# Patient Record
Sex: Female | Born: 1935 | Race: White | Hispanic: No | Marital: Married | Smoking: Former smoker
Health system: Southern US, Community
[De-identification: ages and names within clinical notes are randomized; demographics above are authoritative.]

## PROBLEM LIST (undated history)

## (undated) DIAGNOSIS — IMO0001 Reserved for inherently not codable concepts without codable children: Secondary | ICD-10-CM

## (undated) DIAGNOSIS — F17200 Nicotine dependence, unspecified, uncomplicated: Secondary | ICD-10-CM

## (undated) DIAGNOSIS — K2970 Gastritis, unspecified, without bleeding: Secondary | ICD-10-CM

## (undated) DIAGNOSIS — M8580 Other specified disorders of bone density and structure, unspecified site: Secondary | ICD-10-CM

## (undated) DIAGNOSIS — I10 Essential (primary) hypertension: Secondary | ICD-10-CM

## (undated) DIAGNOSIS — K589 Irritable bowel syndrome without diarrhea: Secondary | ICD-10-CM

## (undated) DIAGNOSIS — G309 Alzheimer's disease, unspecified: Secondary | ICD-10-CM

## (undated) DIAGNOSIS — K5790 Diverticulosis of intestine, part unspecified, without perforation or abscess without bleeding: Secondary | ICD-10-CM

## (undated) DIAGNOSIS — R030 Elevated blood-pressure reading, without diagnosis of hypertension: Secondary | ICD-10-CM

## (undated) DIAGNOSIS — E785 Hyperlipidemia, unspecified: Secondary | ICD-10-CM

## (undated) DIAGNOSIS — K51411 Inflammatory polyps of colon with rectal bleeding: Secondary | ICD-10-CM

## (undated) DIAGNOSIS — K635 Polyp of colon: Secondary | ICD-10-CM

## (undated) DIAGNOSIS — F0280 Dementia in other diseases classified elsewhere without behavioral disturbance: Secondary | ICD-10-CM

## (undated) HISTORY — DX: Essential (primary) hypertension: I10

## (undated) HISTORY — DX: Other specified disorders of bone density and structure, unspecified site: M85.80

## (undated) HISTORY — DX: Reserved for inherently not codable concepts without codable children: IMO0001

## (undated) HISTORY — DX: Inflammatory polyps of colon with rectal bleeding: K51.411

## (undated) HISTORY — DX: Irritable bowel syndrome, unspecified: K58.9

## (undated) HISTORY — DX: Elevated blood-pressure reading, without diagnosis of hypertension: R03.0

## (undated) HISTORY — DX: Diverticulosis of intestine, part unspecified, without perforation or abscess without bleeding: K57.90

## (undated) HISTORY — DX: Hyperlipidemia, unspecified: E78.5

## (undated) HISTORY — DX: Nicotine dependence, unspecified, uncomplicated: F17.200

## (undated) HISTORY — DX: Polyp of colon: K63.5

## (undated) HISTORY — DX: Gastritis, unspecified, without bleeding: K29.70

---

## 2000-03-06 ENCOUNTER — Other Ambulatory Visit: Admission: RE | Admit: 2000-03-06 | Discharge: 2000-03-06 | Payer: Self-pay | Admitting: Internal Medicine

## 2000-03-06 ENCOUNTER — Encounter (INDEPENDENT_AMBULATORY_CARE_PROVIDER_SITE_OTHER): Payer: Self-pay | Admitting: Specialist

## 2001-07-02 ENCOUNTER — Other Ambulatory Visit: Admission: RE | Admit: 2001-07-02 | Discharge: 2001-07-02 | Payer: Self-pay | Admitting: Family Medicine

## 2003-12-29 ENCOUNTER — Other Ambulatory Visit: Admission: RE | Admit: 2003-12-29 | Discharge: 2003-12-29 | Payer: Self-pay | Admitting: Family Medicine

## 2004-01-31 ENCOUNTER — Encounter: Admission: RE | Admit: 2004-01-31 | Discharge: 2004-01-31 | Payer: Self-pay | Admitting: Family Medicine

## 2004-08-16 ENCOUNTER — Ambulatory Visit (HOSPITAL_COMMUNITY): Admission: RE | Admit: 2004-08-16 | Discharge: 2004-08-16 | Payer: Self-pay | Admitting: Family Medicine

## 2004-08-29 LAB — HM COLONOSCOPY

## 2005-01-05 ENCOUNTER — Emergency Department (HOSPITAL_COMMUNITY): Admission: EM | Admit: 2005-01-05 | Discharge: 2005-01-05 | Payer: Self-pay | Admitting: Emergency Medicine

## 2006-04-02 ENCOUNTER — Ambulatory Visit (HOSPITAL_COMMUNITY): Admission: RE | Admit: 2006-04-02 | Discharge: 2006-04-02 | Payer: Self-pay | Admitting: Family Medicine

## 2006-04-28 ENCOUNTER — Other Ambulatory Visit: Admission: RE | Admit: 2006-04-28 | Discharge: 2006-04-28 | Payer: Self-pay | Admitting: Family Medicine

## 2006-07-29 ENCOUNTER — Other Ambulatory Visit: Admission: RE | Admit: 2006-07-29 | Discharge: 2006-07-29 | Payer: Self-pay | Admitting: Family Medicine

## 2006-12-26 ENCOUNTER — Encounter: Admission: RE | Admit: 2006-12-26 | Discharge: 2006-12-26 | Payer: Self-pay | Admitting: Family Medicine

## 2008-03-08 ENCOUNTER — Encounter: Admission: RE | Admit: 2008-03-08 | Discharge: 2008-03-08 | Payer: Self-pay | Admitting: Family Medicine

## 2009-08-24 ENCOUNTER — Encounter: Admission: RE | Admit: 2009-08-24 | Discharge: 2009-08-24 | Payer: Self-pay | Admitting: Family Medicine

## 2010-10-19 ENCOUNTER — Encounter: Admission: RE | Admit: 2010-10-19 | Discharge: 2010-10-19 | Payer: Self-pay | Admitting: Family Medicine

## 2011-04-18 ENCOUNTER — Encounter: Payer: Self-pay | Admitting: Family Medicine

## 2013-03-10 ENCOUNTER — Ambulatory Visit (INDEPENDENT_AMBULATORY_CARE_PROVIDER_SITE_OTHER): Payer: Medicare Other | Admitting: Family Medicine

## 2013-03-10 ENCOUNTER — Encounter: Payer: Self-pay | Admitting: Family Medicine

## 2013-03-10 VITALS — BP 133/71 | HR 77 | Temp 97.6°F | Ht 59.0 in | Wt 136.0 lb

## 2013-03-10 DIAGNOSIS — R5381 Other malaise: Secondary | ICD-10-CM

## 2013-03-10 DIAGNOSIS — I10 Essential (primary) hypertension: Secondary | ICD-10-CM

## 2013-03-10 DIAGNOSIS — R413 Other amnesia: Secondary | ICD-10-CM

## 2013-03-10 DIAGNOSIS — M899 Disorder of bone, unspecified: Secondary | ICD-10-CM

## 2013-03-10 DIAGNOSIS — E785 Hyperlipidemia, unspecified: Secondary | ICD-10-CM | POA: Insufficient documentation

## 2013-03-10 DIAGNOSIS — D649 Anemia, unspecified: Secondary | ICD-10-CM

## 2013-03-10 DIAGNOSIS — M8580 Other specified disorders of bone density and structure, unspecified site: Secondary | ICD-10-CM

## 2013-03-10 LAB — HEPATIC FUNCTION PANEL
ALT: 12 U/L (ref 0–35)
Albumin: 4.4 g/dL (ref 3.5–5.2)
Bilirubin, Direct: 0.1 mg/dL (ref 0.0–0.3)
Total Protein: 7.3 g/dL (ref 6.0–8.3)

## 2013-03-10 LAB — POCT CBC
Granulocyte percent: 68 %G (ref 37–80)
Lymph, poc: 2.3 (ref 0.6–3.4)
MCHC: 34.2 g/dL (ref 31.8–35.4)
MCV: 86.3 fL (ref 80–97)
Platelet Count, POC: 328 10*3/uL (ref 142–424)
RDW, POC: 13.6 %
WBC: 8.8 10*3/uL (ref 4.6–10.2)

## 2013-03-10 LAB — BASIC METABOLIC PANEL WITH GFR
Calcium: 10.2 mg/dL (ref 8.4–10.5)
GFR, Est African American: 60 mL/min
GFR, Est Non African American: 52 mL/min — ABNORMAL LOW
Potassium: 4.5 mEq/L (ref 3.5–5.3)
Sodium: 140 mEq/L (ref 135–145)

## 2013-03-10 LAB — LIPID PANEL
Cholesterol: 257 mg/dL — ABNORMAL HIGH (ref 0–200)
Triglycerides: 214 mg/dL — ABNORMAL HIGH (ref <150)

## 2013-03-10 NOTE — Patient Instructions (Addendum)
FOBT given Continue current meds and therapeutic lifestyle changes Fall precautions discussed

## 2013-03-10 NOTE — Progress Notes (Signed)
  Subjective:    Patient ID: Toni Everett, female    DOB: 02-Apr-1928, 79 y.o.   MRN: [id number]  HPI This patient presents for recheck of multiple medical problems.   There is no problem list on file for this patient.   In addition,see ROS  The allergies, current medications, past medical history, surgical history, family and social history are reviewed.  Immunizations reviewed.  Health maintenance reviewed.         Review of Systems  Constitutional: Negative.   HENT: Negative.   Eyes: Negative.   Respiratory: Negative.   Cardiovascular: Negative.   Gastrointestinal: Negative.   Genitourinary: Positive for urgency.  Musculoskeletal: Positive for arthralgias (R knee).  Neurological: Negative.   Psychiatric/Behavioral: Positive for sleep disturbance (occasional).       Objective:   Physical Exam BP 133/71  Pulse 77  Temp(Src) 97.6 F (36.4 C) (Oral)  Ht 4\' 11"  (1.499 m)  Wt 136 lb (61.689 kg)  BMI 27.45 kg/m2  The patient appeared well nourished and normally developed, alert and oriented to time and place. Speech, behavior and judgement appear normal. Vital signs as documented.  Head exam is unremarkable. No scleral icterus or pallor noted.  Neck is without jugular venous distension, thyromegally, or carotid bruits. Carotid upstrokes are brisk bilaterally. No cervical adenopathy. Lungs are clear anteriorly and posteriorly to auscultation. Normal respiratory effort. Cardiac exam reveals regular rate and rhythm. First and second heart sounds normal. No murmurs, rubs or gallops.  Abdominal exam reveals normal bowl sounds, no masses, no organomegaly and no aortic enlargement. No inguinal adenopathy. Extremities are nonedematous and both femoral and pedal pulses are normal. Skin without pallor or jaundice.  Warm and dry, without rash. Neurologic exam reveals normal deep tendon reflexes and normal sensation.Pt has some decreased memory and admits to  this.          Assessment & Plan:  1. Other and unspecified hyperlipidemia  - Hepatic function panel - Lipid panel  2. Essential hypertension, benign  - BASIC METABOLIC PANEL WITH GFR  3. Osteopenia  - Vitamin D 25 hydroxy  4. Other malaise and fatigue  - POCT CBC  5. Anemia  - POCT CBC  6. Memory deficit Son lives behind patient and helps her with her meds and house chores

## 2013-03-11 ENCOUNTER — Other Ambulatory Visit: Payer: Self-pay | Admitting: Family Medicine

## 2013-03-11 LAB — VITAMIN D 25 HYDROXY (VIT D DEFICIENCY, FRACTURES): Vit D, 25-Hydroxy: 36 ng/mL (ref 30–89)

## 2013-03-12 NOTE — Telephone Encounter (Signed)
SEE ALLERGIES. WILL SEND CHART BACK FOR REVIEW. PLEASE PRINT. MAIL ORDER.

## 2013-04-05 ENCOUNTER — Telehealth: Payer: Self-pay | Admitting: Nurse Practitioner

## 2013-04-05 ENCOUNTER — Telehealth: Payer: Self-pay | Admitting: Family Medicine

## 2013-04-06 NOTE — Telephone Encounter (Signed)
APPT MADE FOR THURS AM WITH MMM

## 2013-04-08 ENCOUNTER — Ambulatory Visit (INDEPENDENT_AMBULATORY_CARE_PROVIDER_SITE_OTHER): Payer: Medicare Other | Admitting: Nurse Practitioner

## 2013-04-08 ENCOUNTER — Encounter: Payer: Self-pay | Admitting: Nurse Practitioner

## 2013-04-08 VITALS — BP 152/69 | HR 63 | Temp 96.9°F | Ht 62.0 in | Wt 137.0 lb

## 2013-04-08 DIAGNOSIS — B3749 Other urogenital candidiasis: Secondary | ICD-10-CM

## 2013-04-08 DIAGNOSIS — R300 Dysuria: Secondary | ICD-10-CM

## 2013-04-08 DIAGNOSIS — L293 Anogenital pruritus, unspecified: Secondary | ICD-10-CM

## 2013-04-08 DIAGNOSIS — N898 Other specified noninflammatory disorders of vagina: Secondary | ICD-10-CM

## 2013-04-08 LAB — POCT UA - MICROSCOPIC ONLY: Crystals, Ur, HPF, POC: NEGATIVE

## 2013-04-08 LAB — POCT URINALYSIS DIPSTICK
Bilirubin, UA: NEGATIVE
Glucose, UA: NEGATIVE
Spec Grav, UA: 1.02

## 2013-04-08 MED ORDER — FLUCONAZOLE 150 MG PO TABS: ORAL_TABLET | ORAL | Status: DC

## 2013-04-08 MED ORDER — CIPROFLOXACIN HCL 500 MG PO TABS: 500.0000 mg | ORAL_TABLET | Freq: Two times a day (BID) | ORAL | Status: DC

## 2013-04-08 NOTE — Patient Instructions (Signed)
Urinary Tract Infection Urinary tract infections (UTIs) can develop anywhere along your urinary tract. Your urinary tract is your body's drainage system for removing wastes and extra water. Your urinary tract includes two kidneys, two ureters, a bladder, and a urethra. Your kidneys are a pair of bean-shaped organs. Each kidney is about the size of your fist. They are located below your ribs, one on each side of your spine. CAUSES Infections are caused by microbes, which are microscopic organisms, including fungi, viruses, and bacteria. These organisms are so small that they can only be seen through a microscope. Bacteria are the microbes that most commonly cause UTIs. SYMPTOMS  Symptoms of UTIs may vary by age and gender of the patient and by the location of the infection. Symptoms in young women typically include a frequent and intense urge to urinate and a painful, burning feeling in the bladder or urethra during urination. Older women and men are more likely to be tired, shaky, and weak and have muscle aches and abdominal pain. A fever may mean the infection is in your kidneys. Other symptoms of a kidney infection include pain in your back or sides below the ribs, nausea, and vomiting. DIAGNOSIS To diagnose a UTI, your caregiver will ask you about your symptoms. Your caregiver also will ask to provide a urine sample. The urine sample will be tested for bacteria and white blood cells. White blood cells are made by your body to help fight infection. TREATMENT  Typically, UTIs can be treated with medication. Because most UTIs are caused by a bacterial infection, they usually can be treated with the use of antibiotics. The choice of antibiotic and length of treatment depend on your symptoms and the type of bacteria causing your infection. HOME CARE INSTRUCTIONS  If you were prescribed antibiotics, take them exactly as your caregiver instructs you. Finish the medication even if you feel better after you  have only taken some of the medication.  Drink enough water and fluids to keep your urine clear or pale yellow.  Avoid caffeine, tea, and carbonated beverages. They tend to irritate your bladder.  Empty your bladder often. Avoid holding urine for long periods of time.  Empty your bladder before and after sexual intercourse.  After a bowel movement, women should cleanse from front to back. Use each tissue only once. SEEK MEDICAL CARE IF:   You have back pain.  You develop a fever.  Your symptoms do not begin to resolve within 3 days. SEEK IMMEDIATE MEDICAL CARE IF:   You have severe back pain or lower abdominal pain.  You develop chills.  You have nausea or vomiting.  You have continued burning or discomfort with urination. MAKE SURE YOU:   Understand these instructions.  Will watch your condition.  Will get help right away if you are not doing well or get worse. Document Released: 09/11/2005 Document Revised: 06/02/2012 Document Reviewed: 01/10/2012 [employer] Patient Information 2013 [employer].  

## 2013-04-08 NOTE — Progress Notes (Signed)
  Subjective:    Patient ID: Toni Everett, female    DOB: 01-01-1928, 79 y.o.   MRN: [id number]  HPI- Patient in today C/O dysuria and vaginal burning. Patient was eating yogurt at home to seee if would help, but has gotten no relief. Patient had some perineal itching a couple of days ago but not so bad now.    Review of Systems  Constitutional: Negative.   Respiratory: Negative.   Cardiovascular: Negative.   Gastrointestinal: Negative for diarrhea and constipation.  Genitourinary: Positive for dysuria and frequency.       Perineal itching       Objective:   Physical Exam  Constitutional: She appears well-developed and well-nourished.  Cardiovascular: Normal rate, normal heart sounds and intact distal pulses.   Pulmonary/Chest: Effort normal and breath sounds normal.  Abdominal: Soft. She exhibits no mass. There is no tenderness. There is no rebound and no guarding.  Genitourinary:  No CVA tenderness bil   BP 152/69  Pulse 63  Temp(Src) 96.9 F (36.1 C) (Oral)  Ht 5\' 2"  (1.575 m)  Wt 137 lb (62.143 kg)  BMI 25.05 kg/m2 Results for orders placed in visit on 04/08/13  POCT URINALYSIS DIPSTICK      Result Value Range   Color, UA yellow     Clarity, UA clear     Glucose, UA neg     Bilirubin, UA neg     Ketones, UA neg     Spec Grav, UA 1.020     Blood, UA trace     pH, UA 6.5     Protein, UA neg     Urobilinogen, UA negative     Nitrite, UA neg     Leukocytes, UA Trace    POCT UA - MICROSCOPIC ONLY      Result Value Range   WBC, Ur, HPF, POC 2-5     RBC, urine, microscopic 1-5     Bacteria, U Microscopic mod     Mucus, UA trace     Epithelial cells, urine per micros mod     Crystals, Ur, HPF, POC neg     Casts, Ur, LPF, POC neg     Yeast, UA neg        Assessment & Plan:  1. Dysuria  - Urinalysis Dipstick - POCT UA - Microscopic Only  2. Vaginal itching  - POCT Wet Prep with KOH  3. Candida UTI Warm compresses Avoid scratching perineum Force  fluids - ciprofloxacin (CIPRO) 500 MG tablet; Take 1 tablet (500 mg total) by mouth 2 (two) times daily.  Dispense: 14 tablet; Refill: 0 - fluconazole (DIFLUCAN) 150 MG tablet; 1 PO now and repeat in 1 week  Dispense: 2 tablet; Refill: 0  Mary-Margaret Daphine Deutscher, FNP

## 2013-04-08 NOTE — Addendum Note (Signed)
Addended by: Orma Render F on: 04/08/2013 11:54 AM   Modules accepted: Orders

## 2013-05-04 ENCOUNTER — Ambulatory Visit (INDEPENDENT_AMBULATORY_CARE_PROVIDER_SITE_OTHER): Payer: Medicare Other

## 2013-05-04 ENCOUNTER — Ambulatory Visit (HOSPITAL_COMMUNITY)
Admission: RE | Admit: 2013-05-04 | Discharge: 2013-05-04 | Disposition: A | Discharge status: Home / Self Care | Payer: Medicare Other | Source: Ambulatory Visit | Attending: Family Medicine | Admitting: Family Medicine

## 2013-05-04 ENCOUNTER — Ambulatory Visit (INDEPENDENT_AMBULATORY_CARE_PROVIDER_SITE_OTHER): Payer: Medicare Other | Admitting: Family Medicine

## 2013-05-04 VITALS — BP 108/68 | HR 72 | Temp 97.4°F | Ht 62.0 in | Wt 135.0 lb

## 2013-05-04 DIAGNOSIS — S7002XA Contusion of left hip, initial encounter: Secondary | ICD-10-CM | POA: Insufficient documentation

## 2013-05-04 DIAGNOSIS — W19XXXA Unspecified fall, initial encounter: Secondary | ICD-10-CM | POA: Insufficient documentation

## 2013-05-04 DIAGNOSIS — M25552 Pain in left hip: Secondary | ICD-10-CM

## 2013-05-04 DIAGNOSIS — M25559 Pain in unspecified hip: Secondary | ICD-10-CM | POA: Insufficient documentation

## 2013-05-04 DIAGNOSIS — S79929A Unspecified injury of unspecified thigh, initial encounter: Secondary | ICD-10-CM | POA: Insufficient documentation

## 2013-05-04 DIAGNOSIS — S79919A Unspecified injury of unspecified hip, initial encounter: Secondary | ICD-10-CM | POA: Insufficient documentation

## 2013-05-04 DIAGNOSIS — M8530 Osteitis condensans, unspecified site: Secondary | ICD-10-CM | POA: Insufficient documentation

## 2013-05-04 DIAGNOSIS — S7000XA Contusion of unspecified hip, initial encounter: Secondary | ICD-10-CM

## 2013-05-04 MED ORDER — TRAMADOL HCL 50 MG PO TABS: 50.0000 mg | ORAL_TABLET | Freq: Three times a day (TID) | ORAL | Status: DC | PRN

## 2013-05-04 NOTE — Progress Notes (Signed)
Subjective:     Patient ID: Toni Everett, female   DOB: 22-Oct-1928, 79 y.o.   MRN: [id number]  HPI Feel on the left hip a few days ago. Right after the hard rain. Foot got stuck in the mud and she slipped and fell to the left. Left hip hurts to bear weight. No wekness in the leg  Past Medical History  Diagnosis Date  . Hyperlipidemia   . IBS (irritable bowel syndrome)   . Nicotine addiction   . Diverticulosis   . Osteopenia   . Elevated blood pressure   . Colon polyps   . Inflammatory polyps of colon with rectal bleeding   . Gastritis    History reviewed. No pertinent past surgical history. Current Outpatient Prescriptions on File Prior to Visit  Medication Sig Dispense Refill  . Cholecalciferol (VITAMIN D3) 1000 UNITS CAPS Take by mouth. Take 1-2 tablets by mouth daily       . lisinopril-hydrochlorothiazide (PRINZIDE,ZESTORETIC) 20-12.5 MG per tablet Take 1 tablet by mouth  every day for Blood  pressure  90 tablet  0   No current facility-administered medications on file prior to visit.   Allergies  Allergen Reactions  . Alcohol-Sulfur (Sulfur)     Patient is intolerant to all alcohol base medicines   . Astelin   . Baycol (Cerivastatin Sodium)   . Cholinesterase Inhibitors   . Clolar (Clofarabine)   . Codeine   . Conj Estrog-Medroxyprogest Ace   . Diovan (Valsartan)   . Fexofenadine Hcl   . Lipitor (Atorvastatin Calcium)   . Lovaza (Omega-3-Acid Ethyl Esters)   . Niaspan (Niacin)   . Norvasc (Amlodipine Besylate)   . Uniretic (Moexipril-Hydrochlorothiazide)     There is no immunization history on file for this patient. History   Social History  . Marital Status: Married    Spouse Name: N/A    Number of Children: 3  . Years of Education: N/A   Occupational History  . Not on file.   Social History Main Topics  . Smoking status: Former Smoker -- 1.00 packs/day    Quit date: 12/16/1998  . Smokeless tobacco: Not on file  . Alcohol Use: No  . Drug Use: No   . Sexually Active: Not on file   Other Topics Concern  . Not on file   Social History Narrative  . No narrative on file    Review of Systems    nil else on ROS. Objective:   Physical Exam APPEARANCE:  Patient in no acute distress.The patient appeared well nourished and normally developed. Acyanotic. Waist: VITAL SIGNS:BP 108/68  Pulse 72  Temp(Src) 97.4 F (36.3 C) (Oral)  Ht 5\' 2"  (1.575 m)  Wt 135 lb (61.236 kg)  BMI 24.69 kg/m2 Elderly frail WF  SKIN: warm and  Dry without overt rashes, tattoos and scars  HEAD and Neck: without JVD, Head and scalp: normal Eyes:No scleral icterus. Fundi normal, eye movements normal. Ears: Auricle normal, canal normal, Tympanic membranes normal, insufflation normal. Nose: normal Throat: normal Neck & thyroid: normal  CHEST & LUNGS: Chest wall: normal Lungs: Clear  CVS: Reveals the PMI to be normally located. Regular rhythm, First and Second Heart sounds are normal,  absence of murmurs, rubs or gallops. Peripheral vasculature: Radial pulses:reduced but present.  ABDOMEN:  Appearance: normal Benign,, no organomegaly, no masses, no Abdominal Aortic enlargement. No Guarding , no rebound. No Bruits. Bowel sounds: normal  RECTAL: N/A GU: N/A  EXTREMETIES: nonedematous.   MUSCULOSKELETAL:  Spine:mild kyphosis  Joints:left hip. Pain to bare weight. Reduced ROM especially withinternal and external rotation. Limping. Leg length equal.  NEUROLOGIC: oriented to ,place and person; nonfocal. Orders Placed This Encounter  Procedures  . DG Hip Complete Left    Standing Status: Future     Number of Occurrences: 1     Standing Expiration Date: 07/04/2014    Order Specific Question:  Reason for Exam (SYMPTOM  OR DIAGNOSIS REQUIRED)    Answer:  left hip pain    Order Specific Question:  Preferred imaging location?    Answer:  Internal   Results for orders placed in visit on 04/08/13  POCT URINALYSIS DIPSTICK      Result  Value Range   Color, UA yellow     Clarity, UA clear     Glucose, UA neg     Bilirubin, UA neg     Ketones, UA neg     Spec Grav, UA 1.020     Blood, UA trace     pH, UA 6.5     Protein, UA neg     Urobilinogen, UA negative     Nitrite, UA neg     Leukocytes, UA Trace    POCT UA - MICROSCOPIC ONLY      Result Value Range   WBC, Ur, HPF, POC 2-5     RBC, urine, microscopic 1-5     Bacteria, U Microscopic mod     Mucus, UA trace     Epithelial cells, urine per micros mod     Crystals, Ur, HPF, POC neg     Casts, Ur, LPF, POC neg     Yeast, UA neg         Assessment:     Hip pain, left - Plan: DG Hip Complete Left, CT Hip Left Wo Contrast, traMADol (ULTRAM) 50 MG tablet, CANCELED: CT Hip Left Wo Contrast, CANCELED: CT Hip Left W Wo Contrast  Contusion of left hip, initial encounter - Plan: traMADol (ULTRAM) 50 MG tablet      Plan:     [facility] reading (PRIMARY) by  Dr.Hebe Merriwether: No obvious Fractures  Seen. But slight shortening of the neck of the femur makes me suspicious about impacted fracture, correlating with clinical findings.                     Ct left hip call report no obvious fracture.  Meds ordered this encounter  Medications  . diphenoxylate-atropine (LOMOTIL) 2.5-0.025 MG per tablet    Sig: Take 1 tablet by mouth 4 (four) times daily as needed for diarrhea or loose stools.  . traMADol (ULTRAM) 50 MG tablet    Sig: Take 1 tablet (50 mg total) by mouth every 8 (eight) hours as needed for pain.    Dispense:  30 tablet    Refill:  0   Ice to left hip, BenGays or Biofreeze.  RTc in 1 week for follow up if not better.

## 2013-05-11 ENCOUNTER — Telehealth: Payer: Self-pay | Admitting: Family Medicine

## 2013-05-11 NOTE — Telephone Encounter (Signed)
APPT MADE

## 2013-05-17 ENCOUNTER — Encounter: Payer: Self-pay | Admitting: Family Medicine

## 2013-05-17 ENCOUNTER — Ambulatory Visit (INDEPENDENT_AMBULATORY_CARE_PROVIDER_SITE_OTHER): Payer: Medicare Other | Admitting: Family Medicine

## 2013-05-17 VITALS — BP 146/67 | HR 73 | Temp 97.5°F | Wt 134.0 lb

## 2013-05-17 DIAGNOSIS — S7002XA Contusion of left hip, initial encounter: Secondary | ICD-10-CM

## 2013-05-17 DIAGNOSIS — S7000XA Contusion of unspecified hip, initial encounter: Secondary | ICD-10-CM

## 2013-05-17 DIAGNOSIS — M25552 Pain in left hip: Secondary | ICD-10-CM

## 2013-05-17 DIAGNOSIS — I10 Essential (primary) hypertension: Secondary | ICD-10-CM

## 2013-05-17 DIAGNOSIS — R413 Other amnesia: Secondary | ICD-10-CM

## 2013-05-17 DIAGNOSIS — M25559 Pain in unspecified hip: Secondary | ICD-10-CM

## 2013-05-17 DIAGNOSIS — E785 Hyperlipidemia, unspecified: Secondary | ICD-10-CM

## 2013-05-17 NOTE — Progress Notes (Signed)
Patient ID: AUBRI GATHRIGHT, female   DOB: Jan 23, 1928, 79 y.o.   MRN: [id number] SUBJECTIVE: HPI: Came for follow up. Had a fall unto left hip[  And had Xrays and CT. Fine now can walk without problems. Getting older.   PMH/PSH: reviewed/updated in Epic  SH/FH: reviewed/updated in Epic  Allergies: reviewed/updated in Epic  Medications: reviewed/updated in Epic  Immunizations: reviewed/updated in Epic  ROS: As above in the HPI. All other systems are stable or negative.  OBJECTIVE: APPEARANCE:  Patient in no acute distress.The patient appeared well nourished and normally developed. Acyanotic. Waist: VITAL SIGNS:BP 146/67  Pulse 73  Temp(Src) 97.5 F (36.4 C) (Oral)  Wt 134 lb (60.782 kg)  BMI 24.5 kg/m2   SKIN: warm and  Dry without overt rashes, tattoos and scars  HEAD and Neck: without JVD, Head and scalp: normal Eyes:No scleral icterus. Fundi normal, eye movements normal. Ears: Auricle normal, canal normal, Tympanic membranes normal, insufflation normal. Nose: normal Throat: normal Neck & thyroid: normal  CHEST & LUNGS: Chest wall: normal Lungs: Clear  CVS: Reveals the PMI to be normally located. Regular rhythm, First and Second Heart sounds are normal,  absence of murmurs, rubs or gallops. Peripheral vasculature: Radial pulses: normal Dorsal pedis pulses: normal Posterior pulses: normal  ABDOMEN:  Appearance: normal Benign,, no organomegaly, no masses, no Abdominal Aortic enlargement. No Guarding , no rebound. No Bruits. Bowel sounds: normal  RECTAL: N/A GU: N/A  EXTREMETIES: nonedematous. Both Femoral and Pedal pulses are normal.  MUSCULOSKELETAL:  Spine: normal Joints: intact  NEUROLOGIC: oriented to time,place and person; nonfocal. Strength is normal Sensory is normal Reflexes are normal Cranial Nerves are normal.  ASSESSMENT: Hip pain, left  Other and unspecified hyperlipidemia  Memory deficit  Essential hypertension,  benign  Contusion of left hip, initial encounter  Hip pain resolved. Better  PLAN: Return in about 6 weeks (around 06/28/2013) for Recheck medical problems.  Continue present meds.  Results for orders placed in visit on 04/08/13  POCT URINALYSIS DIPSTICK      Result Value Range   Color, UA yellow     Clarity, UA clear     Glucose, UA neg     Bilirubin, UA neg     Ketones, UA neg     Spec Grav, UA 1.020     Blood, UA trace     pH, UA 6.5     Protein, UA neg     Urobilinogen, UA negative     Nitrite, UA neg     Leukocytes, UA Trace    POCT UA - MICROSCOPIC ONLY      Result Value Range   WBC, Ur, HPF, POC 2-5     RBC, urine, microscopic 1-5     Bacteria, U Microscopic mod     Mucus, UA trace     Epithelial cells, urine per micros mod     Crystals, Ur, HPF, POC neg     Casts, Ur, LPF, POC neg     Yeast, UA neg     Gwynn Chalker P. [place]esto Charon, M.D.

## 2013-05-27 ENCOUNTER — Encounter: Payer: Self-pay | Admitting: *Deleted

## 2013-06-23 ENCOUNTER — Ambulatory Visit: Payer: Medicare Other | Admitting: Family Medicine

## 2013-07-01 ENCOUNTER — Ambulatory Visit: Payer: Medicare Other | Admitting: Family Medicine

## 2013-08-04 ENCOUNTER — Ambulatory Visit (INDEPENDENT_AMBULATORY_CARE_PROVIDER_SITE_OTHER): Payer: Medicare Other | Admitting: Family Medicine

## 2013-08-04 ENCOUNTER — Encounter: Payer: Self-pay | Admitting: Family Medicine

## 2013-08-04 VITALS — BP 126/68 | HR 70 | Temp 97.7°F | Ht 59.0 in | Wt 130.6 lb

## 2013-08-04 DIAGNOSIS — R5383 Other fatigue: Secondary | ICD-10-CM

## 2013-08-04 DIAGNOSIS — R5381 Other malaise: Secondary | ICD-10-CM

## 2013-08-04 DIAGNOSIS — E559 Vitamin D deficiency, unspecified: Secondary | ICD-10-CM

## 2013-08-04 DIAGNOSIS — R350 Frequency of micturition: Secondary | ICD-10-CM

## 2013-08-04 DIAGNOSIS — I10 Essential (primary) hypertension: Secondary | ICD-10-CM

## 2013-08-04 DIAGNOSIS — J309 Allergic rhinitis, unspecified: Secondary | ICD-10-CM

## 2013-08-04 DIAGNOSIS — E785 Hyperlipidemia, unspecified: Secondary | ICD-10-CM

## 2013-08-04 DIAGNOSIS — R413 Other amnesia: Secondary | ICD-10-CM

## 2013-08-04 LAB — POCT CBC
Granulocyte percent: 65 %G (ref 37–80)
HCT, POC: 41.8 % (ref 37.7–47.9)
Lymph, poc: 1.8 (ref 0.6–3.4)
MCV: 87 fL (ref 80–97)
POC Granulocyte: 3.8 (ref 2–6.9)
Platelet Count, POC: 296 10*3/uL (ref 142–424)
RBC: 4.8 M/uL (ref 4.04–5.48)
RDW, POC: 13.6 %
WBC: 5.9 10*3/uL (ref 4.6–10.2)

## 2013-08-04 LAB — POCT URINALYSIS DIPSTICK
Ketones, UA: NEGATIVE
Nitrite, UA: NEGATIVE
Protein, UA: NEGATIVE

## 2013-08-04 LAB — POCT UA - MICROALBUMIN: Microalbumin Ur, POC: 20 mg/L

## 2013-08-04 LAB — POCT UA - MICROSCOPIC ONLY: Yeast, UA: NEGATIVE

## 2013-08-04 NOTE — Patient Instructions (Addendum)
Fall precautions discussed Continue current meds and therapeutic lifestyle changes Return to clinic in September or October for a flu shot Patient encouraged to schedule  Mammogram Drink plenty of fluids Take an extra strength Tylenol 3 or 4 times daily as needed for arthritis pain

## 2013-08-04 NOTE — Addendum Note (Signed)
Addended by: Lisbeth Ply C on: 08/04/2013 10:55 AM   Modules accepted: Orders

## 2013-08-04 NOTE — Progress Notes (Signed)
Subjective:    Patient ID: Toni Everett, female    DOB: 1928/12/15, 79 y.o.   MRN: [id number]  HPI Patient returns to clinic today for followup and management of chronic medical problems. These include hypertension, hyperlipidemia, osteoarthritis, vitamin D deficiency, and memory deficit. Her biggest complaint today is the arthritis in her knees especially her right knee. When asked specific questions she seemed to have a relapse of memory. She says she walks nightly with her son. She is not very active other than maintaining her home.   Review of Systems  Constitutional: Positive for fatigue (slight).  HENT: Negative for ear pain, congestion, sore throat, rhinorrhea, sneezing and postnasal drip.   Eyes: Positive for visual disturbance (will schedule eye exam). Negative for photophobia, pain, discharge, redness and itching.  Respiratory: Negative.  Negative for cough, choking, chest tightness, shortness of breath and wheezing.   Cardiovascular: Negative.  Negative for chest pain, palpitations and leg swelling.  Gastrointestinal: Positive for diarrhea (intermitent).  Endocrine: Negative.  Negative for cold intolerance, heat intolerance, polydipsia, polyphagia and polyuria.  Genitourinary: Negative for dysuria, urgency, frequency, hematuria, vaginal bleeding, vaginal discharge and vaginal pain.  Musculoskeletal: Positive for myalgias (bilateral legs) and arthralgias (L knee).  Skin: Negative.  Negative for color change, pallor, rash and wound.  Allergic/Immunologic: Positive for environmental allergies (seasonal).  Neurological: Negative for dizziness, tremors, weakness, light-headedness, numbness and headaches.  Hematological: Does not bruise/bleed easily.  Psychiatric/Behavioral: Positive for sleep disturbance (trouble getting to sleep) and decreased concentration (slight memory deficit). Negative for confusion and agitation. The patient is not nervous/anxious.        Objective:   Physical Exam BP 126/68  Pulse 70  Temp(Src) 97.7 F (36.5 C) (Oral)  Ht 4\' 11"  (1.499 m)  Wt 130 lb 9.6 oz (59.24 kg)  BMI 26.36 kg/m2  The patient appeared well nourished and normally developed, but her alertness and orientation to time and place seem to be slightly diminished. Speech, behavior and judgement appear normal. Vital signs as documented.  Head exam is unremarkable. No scleral icterus or pallor noted. Ears nose and throat were normal. Neck is without jugular venous distension, thyromegally, or carotid bruits. Carotid upstrokes are brisk bilaterally. No cervical adenopathy. Lungs are clear anteriorly and posteriorly to auscultation. Normal respiratory effort. Cardiac exam reveals regular rate and rhythm at 72 per min. First and second heart sounds normal.  No murmurs, rubs or gallops.  Abdominal exam reveals normal bowl sounds, no masses, no organomegaly and no aortic enlargement. No inguinal adenopathy. There is no abdominal tenderness. Extremities are nonedematous and both femoral and pedal pulses are normal. Skin without pallor or jaundice.  Warm and dry, without rash. Neurologic exam reveals normal deep tendon reflexes and normal sensation.          Assessment & Plan:  1. Hypertension - POCT CBC - BMP8+EGFR  2. Hyperlipidemia, statin intolerant - Hepatic function panel; Standing - Lipid panel; Standing  3. Fatigue - POCT CBC  4. Vitamin D deficiency - Vit D25 hydroxy(osteoporosis monitoring); Standing  5. Urinary frequency - POCT UA - Microalbumin - POCT UA - Microscopic Only - Urine culture  6. Allergic rhinitis   7. Memory deficits Patient Instructions  Fall precautions discussed Continue current meds and therapeutic lifestyle changes Return to clinic in September or October for a flu shot Patient encouraged to schedule  Mammogram Drink plenty of fluids Take an extra strength Tylenol 3 or 4 times daily as needed for arthritis pain   Roe Coombs  Hoy Register MD

## 2013-08-04 NOTE — Addendum Note (Signed)
Addended by: Roselyn Reef on: 08/04/2013 10:49 AM   Modules accepted: Orders

## 2013-08-04 NOTE — Addendum Note (Signed)
Addended by: Prescott Gum on: 08/04/2013 10:46 AM   Modules accepted: Orders

## 2013-08-04 NOTE — Addendum Note (Signed)
Addended by: Lisbeth Ply C on: 08/04/2013 10:14 AM   Modules accepted: Orders

## 2013-08-05 LAB — HEPATIC FUNCTION PANEL
ALT: 12 IU/L (ref 0–32)
AST: 18 IU/L (ref 0–40)
Albumin: 4.1 g/dL (ref 3.5–4.7)
Bilirubin, Direct: 0.08 mg/dL (ref 0.00–0.40)
Total Bilirubin: 0.3 mg/dL (ref 0.0–1.2)

## 2013-08-05 LAB — BMP8+EGFR
Calcium: 9.7 mg/dL (ref 8.6–10.2)
Creatinine, Ser: 1.09 mg/dL — ABNORMAL HIGH (ref 0.57–1.00)
GFR calc Af Amer: 55 mL/min/{1.73_m2} — ABNORMAL LOW (ref >59)
GFR calc non Af Amer: 47 mL/min/{1.73_m2} — ABNORMAL LOW (ref >59)
Glucose: 86 mg/dL (ref 65–99)

## 2013-08-05 LAB — URINE CULTURE

## 2013-08-05 LAB — LIPID PANEL
Chol/HDL Ratio: 5 ratio units — ABNORMAL HIGH (ref 0.0–4.4)
VLDL Cholesterol Cal: 48 mg/dL — ABNORMAL HIGH (ref 5–40)

## 2013-08-05 LAB — VITAMIN D 25 HYDROXY (VIT D DEFICIENCY, FRACTURES): Vit D, 25-Hydroxy: 20.1 ng/mL — ABNORMAL LOW (ref 30.0–100.0)

## 2013-08-09 ENCOUNTER — Telehealth: Payer: Self-pay | Admitting: *Deleted

## 2013-08-09 NOTE — Telephone Encounter (Signed)
Pt's son Onalee Hua notified of results Pt not currently taking Vit D Please advise

## 2013-08-09 NOTE — Telephone Encounter (Signed)
Pt's son notified

## 2013-08-09 NOTE — Telephone Encounter (Signed)
Message copied by Bearl Mulberry on Mon Aug 09, 2013  1:04 PM ------      Message from: Ernestina Penna      Created: Thu Aug 05, 2013  8:21 AM       On the BMP, blood sugar was good. The creatinine, and important kidney function tests, was slightly elevated as it has been in the past. We will continue to monitor this. All electrolytes were within normal limit.      The vitamin D level was low.-------- confirm the dose of vitamin D that she is currently taking.+++++++++      Liver function tests within normal limit      Only traditional lipid panel, the LDL C. was elevated at 146, this number should be less than 100. The triglycerides were elevated at 238 this should be less than 150---------- she must try to do better with diet and exercise, as she is intolerant of all statins            Please talk to her son, Onalee Hua, regarding all of these results.+++++++++ ------

## 2013-08-09 NOTE — Telephone Encounter (Signed)
Tell her son to get D3 1000  1 daily, for the Now Brand

## 2013-08-16 ENCOUNTER — Other Ambulatory Visit: Payer: Self-pay | Admitting: Family Medicine

## 2013-08-18 ENCOUNTER — Telehealth: Payer: Self-pay | Admitting: Family Medicine

## 2013-08-18 NOTE — Telephone Encounter (Signed)
done

## 2013-08-23 ENCOUNTER — Telehealth: Payer: Self-pay | Admitting: Family Medicine

## 2013-08-24 NOTE — Telephone Encounter (Signed)
Ups already came and meds are there

## 2013-09-20 ENCOUNTER — Telehealth: Payer: Self-pay | Admitting: Family Medicine

## 2013-09-23 ENCOUNTER — Other Ambulatory Visit: Payer: Self-pay | Admitting: *Deleted

## 2013-09-23 MED ORDER — DIPHENOXYLATE-ATROPINE 2.5-0.025 MG PO TABS: 1.0000 | ORAL_TABLET | Freq: Four times a day (QID) | ORAL | Status: DC | PRN

## 2013-09-23 NOTE — Telephone Encounter (Signed)
CALL IN [facility]. LAST OV 8/20.

## 2013-09-23 NOTE — Telephone Encounter (Signed)
Please phone in lomotil

## 2013-09-24 NOTE — Telephone Encounter (Signed)
Script done.

## 2013-09-28 ENCOUNTER — Telehealth: Payer: Self-pay | Admitting: *Deleted

## 2013-09-28 MED ORDER — DIPHENOXYLATE-ATROPINE 2.5-0.025 MG PO TABS: 1.0000 | ORAL_TABLET | Freq: Every day | ORAL | Status: DC

## 2013-09-28 NOTE — Telephone Encounter (Signed)
Son came to office after picking up rx for Lomotil and said she was only taking one qd and directions of rx called in was for qid. I called [facility] and they verified last refill 12/13 was for qd. Discussed with son and he stated she would take one qd as needed. Can you change how she is taking in computer so with next refill we can order for just qd. Thanks.

## 2013-11-29 ENCOUNTER — Telehealth: Payer: Self-pay | Admitting: Family Medicine

## 2013-12-01 ENCOUNTER — Other Ambulatory Visit: Payer: Self-pay

## 2013-12-01 MED ORDER — DIPHENOXYLATE-ATROPINE 2.5-0.025 MG PO TABS: 1.0000 | ORAL_TABLET | Freq: Every day | ORAL | Status: DC

## 2013-12-01 NOTE — Telephone Encounter (Signed)
Please okay this prescription as needed with refill

## 2013-12-02 NOTE — Telephone Encounter (Signed)
rx called in

## 2013-12-23 ENCOUNTER — Ambulatory Visit (INDEPENDENT_AMBULATORY_CARE_PROVIDER_SITE_OTHER): Payer: Medicare Other | Admitting: Family Medicine

## 2013-12-23 ENCOUNTER — Ambulatory Visit (INDEPENDENT_AMBULATORY_CARE_PROVIDER_SITE_OTHER): Payer: Medicare Other

## 2013-12-23 ENCOUNTER — Encounter (INDEPENDENT_AMBULATORY_CARE_PROVIDER_SITE_OTHER): Payer: Self-pay

## 2013-12-23 ENCOUNTER — Encounter: Payer: Self-pay | Admitting: Family Medicine

## 2013-12-23 VITALS — BP 144/66 | HR 71 | Temp 96.8°F | Ht 59.0 in | Wt 129.0 lb

## 2013-12-23 DIAGNOSIS — R413 Other amnesia: Secondary | ICD-10-CM

## 2013-12-23 DIAGNOSIS — E785 Hyperlipidemia, unspecified: Secondary | ICD-10-CM

## 2013-12-23 DIAGNOSIS — Z789 Other specified health status: Secondary | ICD-10-CM

## 2013-12-23 DIAGNOSIS — I10 Essential (primary) hypertension: Secondary | ICD-10-CM

## 2013-12-23 DIAGNOSIS — Z23 Encounter for immunization: Secondary | ICD-10-CM

## 2013-12-23 DIAGNOSIS — Z0000 Encounter for general adult medical examination without abnormal findings: Secondary | ICD-10-CM

## 2013-12-23 LAB — POCT CBC
Granulocyte percent: 71.6 %G (ref 37–80)
HCT, POC: 46 % (ref 37.7–47.9)
Hemoglobin: 14.6 g/dL (ref 12.2–16.2)
LYMPH, POC: 1.9 (ref 0.6–3.4)
MCH, POC: 28.3 pg (ref 27–31.2)
MCHC: 31.7 g/dL — AB (ref 31.8–35.4)
MCV: 89.2 fL (ref 80–97)
MPV: 7.5 fL (ref 0–99.8)
POC GRANULOCYTE: 5.8 (ref 2–6.9)
POC LYMPH %: 23.8 % (ref 10–50)
Platelet Count, POC: 307 10*3/uL (ref 142–424)
RBC: 5.2 M/uL (ref 4.04–5.48)
RDW, POC: 12.9 %
WBC: 8.1 10*3/uL (ref 4.6–10.2)

## 2013-12-23 MED ORDER — DONEPEZIL HCL 5 MG PO TABS: 5.0000 mg | ORAL_TABLET | Freq: Every day | ORAL | Status: DC

## 2013-12-23 NOTE — Addendum Note (Signed)
Addended by: Magdalene RiverBULLINS, JAMIE H on: 12/23/2013 12:50 PM   Modules accepted: Orders

## 2013-12-23 NOTE — Patient Instructions (Addendum)
Continue current medications. Continue good therapeutic lifestyle changes which include good diet and exercise. Fall precautions discussed with patient. Schedule your flu vaccine if you haven't had it yet If you are over 153 years old - you may need Prevnar 13 or the adult Pneumonia vaccine. When you see your new doctor please sign a records release and we will see that our records are transferred to her The splinter drink plenty of fluids and use a cool mist humidifier to keep your respiratory passages moist

## 2013-12-23 NOTE — Progress Notes (Signed)
Subjective:    Patient ID: Toni Everett, female    DOB: 1927/12/24, 80 y.o.   MRN: [id number]  HPI Pt here for follow up and management of chronic medical problems. Patient comes in today to transfer her care to a more convenient location as her son has just retired and he will be taking her to his medical group. It was obvious in the initial visit to the room with the patient today that her memory is declining very rapidly. She did not have an appropriate response to many of the questions that are posed to her.        Patient Active Problem List   Diagnosis Date Noted  . Hip pain 05/04/2013  . Contusion of left hip 05/04/2013  . Essential hypertension, benign 03/10/2013  . Other and unspecified hyperlipidemia 03/10/2013  . Memory deficit 03/10/2013   Outpatient Encounter Prescriptions as of 12/23/2013  Medication Sig  . diphenoxylate-atropine (LOMOTIL) 2.5-0.025 MG per tablet Take 1 tablet by mouth daily.  Marland Kitchen lisinopril-hydrochlorothiazide (PRINZIDE,ZESTORETIC) 20-12.5 MG per tablet Take 1 tablet by mouth  every day for Blood  pressure    Review of Systems  Constitutional: Negative.   HENT: Negative.   Eyes: Negative.   Respiratory: Negative.   Cardiovascular: Negative.   Gastrointestinal: Negative.   Endocrine: Negative.   Genitourinary: Negative.   Musculoskeletal: Negative.   Skin: Negative.   Allergic/Immunologic: Negative.   Neurological: Negative.   Hematological: Negative.   Psychiatric/Behavioral: Negative.        Objective:   Physical Exam  Nursing note and vitals reviewed. Constitutional: She is oriented to person, place, and time. She appears well-developed and well-nourished. No distress.  Suite and pleasant but with obvious memory deficits more than in the past  HENT:  Head: Normocephalic and atraumatic.  Right Ear: External ear normal.  Left Ear: External ear normal.  Nose: Nose normal.  Mouth/Throat: Oropharynx is clear and moist. No  oropharyngeal exudate.  Eyes: Conjunctivae and EOM are normal. Pupils are equal, round, and reactive to light. Right eye exhibits no discharge. Left eye exhibits no discharge. No scleral icterus.  Neck: Normal range of motion. Neck supple. No thyromegaly present.  Cardiovascular: Normal rate, regular rhythm, normal heart sounds and intact distal pulses.  Exam reveals no gallop and no friction rub.   No murmur heard. At 72 per minute  Pulmonary/Chest: Effort normal and breath sounds normal. No respiratory distress. She has no wheezes. She has no rales. She exhibits no tenderness.  No axillary adenopathy  Abdominal: Soft. Bowel sounds are normal. She exhibits no mass. There is no tenderness. There is no rebound and no guarding.  Musculoskeletal: Normal range of motion. She exhibits no edema and no tenderness.  Lymphadenopathy:    She has no cervical adenopathy.  Neurological: She is alert and oriented to person, place, and time. She has normal reflexes.  Definite memory deficits apparent today in the exam. She did not know her age she would not sure who was the father of her son and she simply stated that I just do not try remember anything anymore.  Skin: Skin is warm and dry.  Psychiatric: She has a normal mood and affect. Her behavior is normal.   BP 144/66  Pulse 71  Temp(Src) 96.8 F (36 C) (Oral)  Ht 4' 11"  (1.499 m)  Wt 129 lb (58.514 kg)  BMI 26.04 kg/m2  [facility] reading (PRIMARY) by  Dr. Brunilda Payor x-ray-kyphosis no active disease  Assessment & Plan:   1. Essential hypertension, benign - POCT CBC - BMP8+EGFR - Hepatic function panel  2. Other and unspecified hyperlipidemia - POCT CBC - Lipid panel  3. Health care maintenance - Vit D  25 hydroxy (rtn osteoporosis monitoring)  4. Statin intolerance  5. Memory deficit -Discussed with her son today the need for more assertive a lot -Will start Aricept 5 mg daily and we'll increase  this dose in a couple of months to 10 mg -Also discussed the possible need for a lifeline for her at home   Patient Instructions  Continue current medications. Continue good therapeutic lifestyle changes which include good diet and exercise. Fall precautions discussed with patient. Schedule your flu vaccine if you haven't had it yet If you are over 68 years old - you may need Prevnar 84 or the adult Pneumonia vaccine. When you see your new doctor please sign a records release and we will see that our records are transferred to her The splinter drink plenty of fluids and use a cool mist humidifier to keep your respiratory passages moist   Arrie Senate MD

## 2013-12-24 LAB — BMP8+EGFR
BUN/Creatinine Ratio: 20 (ref 11–26)
BUN: 20 mg/dL (ref 8–27)
CHLORIDE: 101 mmol/L (ref 97–108)
CO2: 27 mmol/L (ref 18–29)
Calcium: 10.1 mg/dL (ref 8.6–10.2)
Creatinine, Ser: 0.99 mg/dL (ref 0.57–1.00)
GFR calc Af Amer: 61 mL/min/{1.73_m2} (ref >59)
GFR calc non Af Amer: 53 mL/min/{1.73_m2} — ABNORMAL LOW (ref >59)
GLUCOSE: 81 mg/dL (ref 65–99)
POTASSIUM: 4 mmol/L (ref 3.5–5.2)
Sodium: 143 mmol/L (ref 134–144)

## 2013-12-24 LAB — HEPATIC FUNCTION PANEL
ALBUMIN: 4.5 g/dL (ref 3.5–4.7)
ALK PHOS: 98 IU/L (ref 39–117)
ALT: 12 IU/L (ref 0–32)
AST: 21 IU/L (ref 0–40)
Bilirubin, Direct: 0.09 mg/dL (ref 0.00–0.40)
TOTAL PROTEIN: 7.3 g/dL (ref 6.0–8.5)
Total Bilirubin: 0.3 mg/dL (ref 0.0–1.2)

## 2013-12-24 LAB — LIPID PANEL
CHOL/HDL RATIO: 4.6 ratio — AB (ref 0.0–4.4)
CHOLESTEROL TOTAL: 256 mg/dL — AB (ref 100–199)
HDL: 56 mg/dL (ref >39)
LDL Calculated: 160 mg/dL — ABNORMAL HIGH (ref 0–99)
TRIGLYCERIDES: 201 mg/dL — AB (ref 0–149)
VLDL Cholesterol Cal: 40 mg/dL (ref 5–40)

## 2013-12-24 LAB — VITAMIN D 25 HYDROXY (VIT D DEFICIENCY, FRACTURES): Vit D, 25-Hydroxy: 32.5 ng/mL (ref 30.0–100.0)

## 2013-12-29 ENCOUNTER — Telehealth: Payer: Self-pay | Admitting: Family Medicine
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
## 2013-12-29 NOTE — Telephone Encounter (Signed)
I spoke with the patient's son and told him to discontinue the Aricept that he was giving to his mother and that if she did not do better her get back to her usual self to call us back. He had just started the directions and will follow through on our request

## 2014-02-28 ENCOUNTER — Telehealth: Payer: Self-pay | Admitting: Family Medicine

## 2014-02-28 NOTE — Telephone Encounter (Signed)
Son is concerned because she seems agitated and confused at times. She doesn't like the idea of an in home nurse. Her son is concerned that she may wonder off or injur herself and it is beginning to be too difficult for him to handle.  He feels like he has to stay at home and watch her at all times.   Patient scheduled for 03/07/14 to see Dr. Christell ConstantMoore to discuss assisted living or nursing home placement.   Encouraged her son to take her to the ED if she posed an immediate threat to herself or others or to call us back if necessary.  Son stated understanding and agreement to plan.

## 2014-03-07 ENCOUNTER — Ambulatory Visit: Payer: Medicare Other | Admitting: Family Medicine

## 2014-04-08 ENCOUNTER — Other Ambulatory Visit: Payer: Self-pay | Admitting: Family Medicine

## 2014-04-21 ENCOUNTER — Ambulatory Visit (INDEPENDENT_AMBULATORY_CARE_PROVIDER_SITE_OTHER): Payer: Medicare Other | Admitting: Family Medicine

## 2014-04-21 ENCOUNTER — Encounter: Payer: Self-pay | Admitting: Family Medicine

## 2014-04-21 VITALS — BP 129/62 | HR 75 | Temp 96.6°F | Ht 59.0 in | Wt 129.0 lb

## 2014-04-21 DIAGNOSIS — R451 Restlessness and agitation: Secondary | ICD-10-CM

## 2014-04-21 DIAGNOSIS — F0390 Unspecified dementia without behavioral disturbance: Secondary | ICD-10-CM

## 2014-04-21 DIAGNOSIS — R413 Other amnesia: Secondary | ICD-10-CM

## 2014-04-21 DIAGNOSIS — I10 Essential (primary) hypertension: Secondary | ICD-10-CM

## 2014-04-21 DIAGNOSIS — E785 Hyperlipidemia, unspecified: Secondary | ICD-10-CM

## 2014-04-21 DIAGNOSIS — Z0000 Encounter for general adult medical examination without abnormal findings: Secondary | ICD-10-CM

## 2014-04-21 DIAGNOSIS — IMO0002 Reserved for concepts with insufficient information to code with codable children: Secondary | ICD-10-CM

## 2014-04-21 LAB — POCT CBC
GRANULOCYTE PERCENT: 69.5 % (ref 37–80)
HEMATOCRIT: 41.2 % (ref 37.7–47.9)
HEMOGLOBIN: 13.3 g/dL (ref 12.2–16.2)
LYMPH, POC: 1.5 (ref 0.6–3.4)
MCH, POC: 28.4 pg (ref 27–31.2)
MCHC: 32.2 g/dL (ref 31.8–35.4)
MCV: 88.3 fL (ref 80–97)
MPV: 7.7 fL (ref 0–99.8)
POC GRANULOCYTE: 3.9 (ref 2–6.9)
POC LYMPH PERCENT: 26.2 %L (ref 10–50)
Platelet Count, POC: 270 10*3/uL (ref 142–424)
RBC: 4.7 M/uL (ref 4.04–5.48)
RDW, POC: 13.2 %
WBC: 5.6 10*3/uL (ref 4.6–10.2)

## 2014-04-21 MED ORDER — RIVASTIGMINE 9.5 MG/24HR TD PT24: 9.5000 mg | MEDICATED_PATCH | Freq: Every day | TRANSDERMAL | Status: DC

## 2014-04-21 MED ORDER — RIVASTIGMINE 4.6 MG/24HR TD PT24: 4.6000 mg | MEDICATED_PATCH | Freq: Every day | TRANSDERMAL | Status: DC

## 2014-04-21 MED ORDER — RIVASTIGMINE 13.3 MG/24HR TD PT24
1.0000 | MEDICATED_PATCH | Freq: Every day | TRANSDERMAL | Status: DC
Start: 2014-04-21 — End: 2014-04-27

## 2014-04-21 MED ORDER — LISINOPRIL-HYDROCHLOROTHIAZIDE 20-12.5 MG PO TABS: ORAL_TABLET | ORAL | Status: AC

## 2014-04-21 NOTE — Progress Notes (Signed)
Subjective:    Patient ID: Toni Everett, female    DOB: 03/19/1928, 80 y.o.   MRN: [id number]  HPI Pt here for follow up and management of chronic medical problems. She does complain of a cyst on her left finger and she continues to have problems with her dementia and memory. Her son accompanies her to the visit today. He indicates he she's been very agitated and forgetful at home, he gives her her blood pressure medication every morning. She was intolerant of the Aricept he'll. We will probably try the Exelon patch on her. She does complain of insomnia. She needs her lisinopril refilled and we will do that today.        Patient Active Problem List   Diagnosis Date Noted  . Statin intolerance 12/23/2013  . Hip pain 05/04/2013  . Contusion of left hip 05/04/2013  . Essential hypertension, benign 03/10/2013  . Hyperlipidemia 03/10/2013  . Memory deficit 03/10/2013   Outpatient Encounter Prescriptions as of 04/21/2014  Medication Sig  . lisinopril-hydrochlorothiazide (PRINZIDE,ZESTORETIC) 20-12.5 MG per tablet Take 1 tablet by mouth  every day for blood  pressure  . diphenoxylate-atropine (LOMOTIL) 2.5-0.025 MG per tablet Take 1 tablet by mouth daily.  . [DISCONTINUED] donepezil (ARICEPT) 5 MG tablet Take 1 tablet (5 mg total) by mouth at bedtime.    Review of Systems  Constitutional: Negative.   HENT: Negative.   Eyes: Negative.   Respiratory: Negative.   Cardiovascular: Negative.   Gastrointestinal: Negative.   Endocrine: Negative.   Genitourinary: Negative.   Musculoskeletal: Positive for arthralgias.  Skin: Negative.        Left middle finger - cyst  Allergic/Immunologic: Negative.   Neurological: Negative.   Hematological: Negative.   Psychiatric/Behavioral: Positive for confusion (memory deficit- the more sleep - the better) and sleep disturbance.       Objective:   Physical Exam  Nursing note and vitals reviewed. Constitutional: She appears well-developed  and well-nourished. No distress.  Pleasant today but obvious diminished memory.  HENT:  Head: Normocephalic and atraumatic.  Right Ear: External ear normal.  Left Ear: External ear normal.  Nose: Nose normal.  Mouth/Throat: Oropharynx is clear and moist. No oropharyngeal exudate.  Eyes: Conjunctivae and EOM are normal. Pupils are equal, round, and reactive to light. Right eye exhibits no discharge. Left eye exhibits no discharge. No scleral icterus.  Neck: Normal range of motion. Neck supple. No thyromegaly present.  No carotid bruits  Cardiovascular: Normal rate, regular rhythm, normal heart sounds and intact distal pulses.  Exam reveals no gallop and no friction rub.   No murmur heard. At 72 per minute  Pulmonary/Chest: Effort normal and breath sounds normal. No respiratory distress. She has no wheezes. She has no rales. She exhibits no tenderness.  Abdominal: Soft. Bowel sounds are normal. She exhibits no mass. There is no tenderness. There is no rebound and no guarding.  Musculoskeletal: Normal range of motion. She exhibits no edema and no tenderness.  Lymphadenopathy:    She has no cervical adenopathy.  Neurological: She is alert. She has normal reflexes. No cranial nerve deficit.  Skin: Skin is warm and dry. No rash noted.  Psychiatric: She has a normal mood and affect. Her behavior is normal.  Obvious diminished memory but patient was calm during the visit. Future testing and referrals were explained to her and she seemed to understand this was repeated to her with her son being present and she was again understood. Also explained the need  for using a patch to help her memory and she agreed to having this done.   BP 129/62  Pulse 75  Temp(Src) 96.6 F (35.9 C) (Oral)  Ht _0  (1.499 m)  Wt 129 lb (58.514 kg)  BMI 26.04 kg/m2        Assessment & Plan:  1. Essential hypertension, benig- POCT CBC - BMP8+EGFR - Hepatic function panel - Thyroid Panel With TSH  2.  Hyperlipidemia - POCT CBC - Lipid panel - Thyroid Panel With TSH  3. Memory deficit - POCT CBC - Ambulatory referral to Neurology - Thyroid Panel With TSH  4. Health care maintenance - Vit D  25 hydroxy (rtn osteoporosis monitoring) - Thyroid Panel With TSH  5. Agitation  6. Dementia -Neurology referral -Exelon patch  Patient Instructions                       Medicare Annual Wellness Visit  [place] and the medical providers at [facility] strive to bring you the best medical care.  In doing so we not only want to address your current medical conditions and concerns but also to detect new conditions early and prevent illness, disease and health-related problems.    Medicare offers a yearly Wellness Visit which allows our clinical staff to assess your need for preventative services including immunizations, lifestyle education, counseling to decrease risk of preventable diseases and screening for fall risk and other medical concerns.    This visit is provided free of charge (no copay) for all Medicare recipients. The clinical pharmacists at [facility] have begun to conduct these Wellness Visits which will also include a thorough review of all your medications.    As you primary medical provider recommend that you make an appointment for your Annual Wellness Visit if you have not done so already this year.  You may set up this appointment before you leave today or you may call back [phone]) and schedule an appointment.  Please make sure when you call that you mention that you are scheduling your Annual Wellness Visit with the clinical pharmacist so that the appointment may be made for the proper length of time.      Continue current medications. Continue good therapeutic lifestyle changes which include good diet and exercise. Fall precautions discussed with patient. If an FOBT was given today- please return it to our front  desk. If you are over 42 years old - you may need Prevnar 89 or the adult Pneumonia vaccine.  Arrange a visit with the neurologist because of the ongoing agitation and dementia and memory loss. The son was instructed to pursue nursing home placement is with memory impaired care We will call her with the lab work results once those results are available Tylenol PM can be tried for insomnia if needed.   Arrie Senate MD

## 2014-04-21 NOTE — Patient Instructions (Addendum)
Medicare Annual Wellness Visit  [place] and the medical providers at [facility] strive to bring you the best medical care.  In doing so we not only want to address your current medical conditions and concerns but also to detect new conditions early and prevent illness, disease and health-related problems.    Medicare offers a yearly Wellness Visit which allows our clinical staff to assess your need for preventative services including immunizations, lifestyle education, counseling to decrease risk of preventable diseases and screening for fall risk and other medical concerns.    This visit is provided free of charge (no copay) for all Medicare recipients. The clinical pharmacists at [facility] have begun to conduct these Wellness Visits which will also include a thorough review of all your medications.    As you primary medical provider recommend that you make an appointment for your Annual Wellness Visit if you have not done so already this year.  You may set up this appointment before you leave today or you may call back [phone] and schedule an appointment.  Please make sure when you call that you mention that you are scheduling your Annual Wellness Visit with the clinical pharmacist so that the appointment may be made for the proper length of time.      Continue current medications. Continue good therapeutic lifestyle changes which include good diet and exercise. Fall precautions discussed with patient. If an FOBT was given today- please return it to our front desk. If you are over 78 years old - you may need Prevnar 13 or the adult Pneumonia vaccine.  Arrange a visit with the neurologist because of the ongoing agitation and dementia and memory loss. The son was instructed to pursue nursing home placement is with memory impaired care We will call her with the lab work results once those results are available Tylenol PM  can be tried for insomnia if needed.

## 2014-04-26 ENCOUNTER — Encounter (HOSPITAL_COMMUNITY): Payer: Self-pay | Admitting: Emergency Medicine

## 2014-04-26 ENCOUNTER — Emergency Department (HOSPITAL_COMMUNITY)
Admission: EM | Admit: 2014-04-26 | Discharge: 2014-05-01 | Disposition: A | Discharge status: Other Acute Inpatient Hospital | Payer: Medicare Other | Attending: Emergency Medicine | Admitting: Emergency Medicine

## 2014-04-26 ENCOUNTER — Emergency Department (HOSPITAL_COMMUNITY): Payer: Medicare Other

## 2014-04-26 DIAGNOSIS — F0390 Unspecified dementia without behavioral disturbance: Secondary | ICD-10-CM

## 2014-04-26 DIAGNOSIS — G309 Alzheimer's disease, unspecified: Secondary | ICD-10-CM | POA: Insufficient documentation

## 2014-04-26 DIAGNOSIS — F22 Delusional disorders: Secondary | ICD-10-CM | POA: Insufficient documentation

## 2014-04-26 DIAGNOSIS — Z860100 Personal history of colon polyps, unspecified: Secondary | ICD-10-CM | POA: Insufficient documentation

## 2014-04-26 DIAGNOSIS — Z87891 Personal history of nicotine dependence: Secondary | ICD-10-CM | POA: Insufficient documentation

## 2014-04-26 DIAGNOSIS — IMO0002 Reserved for concepts with insufficient information to code with codable children: Secondary | ICD-10-CM | POA: Insufficient documentation

## 2014-04-26 DIAGNOSIS — R4182 Altered mental status, unspecified: Secondary | ICD-10-CM | POA: Insufficient documentation

## 2014-04-26 DIAGNOSIS — Z79899 Other long term (current) drug therapy: Secondary | ICD-10-CM | POA: Insufficient documentation

## 2014-04-26 DIAGNOSIS — Z862 Personal history of diseases of the blood and blood-forming organs and certain disorders involving the immune mechanism: Secondary | ICD-10-CM | POA: Insufficient documentation

## 2014-04-26 DIAGNOSIS — Z8739 Personal history of other diseases of the musculoskeletal system and connective tissue: Secondary | ICD-10-CM | POA: Insufficient documentation

## 2014-04-26 DIAGNOSIS — F911 Conduct disorder, childhood-onset type: Secondary | ICD-10-CM | POA: Insufficient documentation

## 2014-04-26 DIAGNOSIS — Z8639 Personal history of other endocrine, nutritional and metabolic disease: Secondary | ICD-10-CM | POA: Insufficient documentation

## 2014-04-26 DIAGNOSIS — R451 Restlessness and agitation: Secondary | ICD-10-CM

## 2014-04-26 DIAGNOSIS — Z8719 Personal history of other diseases of the digestive system: Secondary | ICD-10-CM | POA: Insufficient documentation

## 2014-04-26 DIAGNOSIS — R443 Hallucinations, unspecified: Secondary | ICD-10-CM | POA: Insufficient documentation

## 2014-04-26 DIAGNOSIS — F0280 Dementia in other diseases classified elsewhere without behavioral disturbance: Secondary | ICD-10-CM | POA: Insufficient documentation

## 2014-04-26 DIAGNOSIS — F29 Unspecified psychosis not due to a substance or known physiological condition: Secondary | ICD-10-CM | POA: Insufficient documentation

## 2014-04-26 DIAGNOSIS — R410 Disorientation, unspecified: Secondary | ICD-10-CM

## 2014-04-26 DIAGNOSIS — F411 Generalized anxiety disorder: Secondary | ICD-10-CM | POA: Insufficient documentation

## 2014-04-26 DIAGNOSIS — I10 Essential (primary) hypertension: Secondary | ICD-10-CM | POA: Insufficient documentation

## 2014-04-26 DIAGNOSIS — E876 Hypokalemia: Secondary | ICD-10-CM | POA: Insufficient documentation

## 2014-04-26 HISTORY — DX: Alzheimer's disease, unspecified: G30.9

## 2014-04-26 HISTORY — DX: Dementia in other diseases classified elsewhere, unspecified severity, without behavioral disturbance, psychotic disturbance, mood disturbance, and anxiety: F02.80

## 2014-04-26 LAB — COMPREHENSIVE METABOLIC PANEL
ALBUMIN: 3.7 g/dL (ref 3.5–5.2)
ALT: 15 U/L (ref 0–35)
AST: 30 U/L (ref 0–37)
Alkaline Phosphatase: 80 U/L (ref 39–117)
BUN: 32 mg/dL — ABNORMAL HIGH (ref 6–23)
CALCIUM: 9.7 mg/dL (ref 8.4–10.5)
CO2: 24 mEq/L (ref 19–32)
Chloride: 106 mEq/L (ref 96–112)
Creatinine, Ser: 1.38 mg/dL — ABNORMAL HIGH (ref 0.50–1.10)
GFR calc Af Amer: 40 mL/min — ABNORMAL LOW (ref >90)
GFR calc non Af Amer: 34 mL/min — ABNORMAL LOW (ref >90)
Glucose, Bld: 93 mg/dL (ref 70–99)
Potassium: 3.1 mEq/L — ABNORMAL LOW (ref 3.7–5.3)
SODIUM: 145 meq/L (ref 137–147)
TOTAL PROTEIN: 7 g/dL (ref 6.0–8.3)
Total Bilirubin: 0.5 mg/dL (ref 0.3–1.2)

## 2014-04-26 LAB — URINALYSIS, ROUTINE W REFLEX MICROSCOPIC
Bilirubin Urine: NEGATIVE
Glucose, UA: NEGATIVE mg/dL
HGB URINE DIPSTICK: NEGATIVE
Ketones, ur: NEGATIVE mg/dL
Leukocytes, UA: NEGATIVE
Nitrite: NEGATIVE
Protein, ur: NEGATIVE mg/dL
UROBILINOGEN UA: 0.2 mg/dL (ref 0.0–1.0)
pH: 5.5 (ref 5.0–8.0)

## 2014-04-26 LAB — CBC
HCT: 37.9 % (ref 36.0–46.0)
Hemoglobin: 12.9 g/dL (ref 12.0–15.0)
MCH: 30 pg (ref 26.0–34.0)
MCHC: 34 g/dL (ref 30.0–36.0)
MCV: 88.1 fL (ref 78.0–100.0)
PLATELETS: 261 10*3/uL (ref 150–400)
RBC: 4.3 MIL/uL (ref 3.87–5.11)
RDW: 13.2 % (ref 11.5–15.5)
WBC: 7.2 10*3/uL (ref 4.0–10.5)

## 2014-04-26 LAB — RAPID URINE DRUG SCREEN, HOSP PERFORMED
AMPHETAMINES: NOT DETECTED
Barbiturates: NOT DETECTED
Benzodiazepines: NOT DETECTED
Cocaine: NOT DETECTED
OPIATES: NOT DETECTED
TETRAHYDROCANNABINOL: NOT DETECTED

## 2014-04-26 LAB — ETHANOL: Alcohol, Ethyl (B): <11 mg/dL (ref 0–11)

## 2014-04-26 LAB — ACETAMINOPHEN LEVEL: Acetaminophen (Tylenol), Serum: <15 ug/mL (ref 10–30)

## 2014-04-26 LAB — SALICYLATE LEVEL: Salicylate Lvl: <2 mg/dL — ABNORMAL LOW (ref 2.8–20.0)

## 2014-04-26 MED ORDER — RIVASTIGMINE 4.6 MG/24HR TD PT24: 4.6000 mg | MEDICATED_PATCH | Freq: Every day | TRANSDERMAL | Status: DC

## 2014-04-26 MED ORDER — LORAZEPAM 2 MG/ML IJ SOLN
0.5000 mg | Freq: Once | INTRAMUSCULAR | Status: AC
  Administered 2014-04-26: 0.5 mg via INTRAMUSCULAR
  Filled 2014-04-26: qty 1

## 2014-04-26 MED ORDER — HYDROCHLOROTHIAZIDE 12.5 MG PO CAPS
12.5000 mg | ORAL_CAPSULE | Freq: Every day | ORAL | Status: DC
  Administered 2014-04-27 – 2014-04-30 (×4): 12.5 mg via ORAL
  Filled 2014-04-26 (×10): qty 1

## 2014-04-26 MED ORDER — LISINOPRIL 10 MG PO TABS
20.0000 mg | ORAL_TABLET | Freq: Every day | ORAL | Status: DC
  Administered 2014-04-27 – 2014-04-30 (×4): 20 mg via ORAL
  Filled 2014-04-26 (×4): qty 2

## 2014-04-26 MED ORDER — LISINOPRIL-HYDROCHLOROTHIAZIDE 20-12.5 MG PO TABS: 1.0000 | ORAL_TABLET | Freq: Every day | ORAL | Status: DC

## 2014-04-26 MED ORDER — RIVASTIGMINE 9.5 MG/24HR TD PT24: 9.5000 mg | MEDICATED_PATCH | Freq: Every day | TRANSDERMAL | Status: DC

## 2014-04-26 MED ORDER — LORAZEPAM 2 MG/ML IJ SOLN
0.5000 mg | Freq: Four times a day (QID) | INTRAMUSCULAR | Status: DC | PRN
  Administered 2014-04-27 – 2014-04-30 (×3): 0.5 mg via INTRAMUSCULAR
  Filled 2014-04-26 (×5): qty 1

## 2014-04-26 MED ORDER — RIVASTIGMINE 13.3 MG/24HR TD PT24
1.0000 | MEDICATED_PATCH | Freq: Every day | TRANSDERMAL | Status: DC
  Filled 2014-04-26 (×3): qty 1

## 2014-04-26 NOTE — ED Notes (Addendum)
Pt removed from all restraints. Pt calm at present talking w/ [employer].
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
## 2014-04-26 NOTE — ED Notes (Addendum)
Pt ambulated to bathroom with minimal assistance. Pt reported seeing things on the floor that were not there and dropping something which she was not holding. Pt became slightly unsteady when she reported these hallucinations. She was able to follow simple commands without difficulty.
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
## 2014-04-26 NOTE — ED Notes (Signed)
Has become more calm after medication. One wrist restraint & shackle remove.  [employer] remains w/ pt.

## 2014-04-26 NOTE — ED Notes (Signed)
Pt served IVC paperwork 
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
## 2014-04-26 NOTE — ED Notes (Signed)
Removed 2 ticks off pt when pt placed in paper scrubs.

## 2014-04-27 MED ORDER — HALOPERIDOL LACTATE 5 MG/ML IJ SOLN
2.0000 mg | Freq: Once | INTRAMUSCULAR | Status: AC
  Administered 2014-04-27: 2 mg via INTRAMUSCULAR
  Filled 2014-04-27: qty 1

## 2014-04-27 NOTE — ED Notes (Addendum)
Pt yelling and combative. Pt attempted to leave via EMS doors. Pt redirected back into room 3 by RN, NT and Security. 0.5mg  IM Ativan given. [employer] called and EDP notified.

## 2014-04-27 NOTE — ED Notes (Signed)
[facility] Toni Everett spoke with pt son, [facility]alee [facility]David [facility]). Plan for pt is placement at geriatric psychiatric facility placement.

## 2014-04-27 NOTE — ED Notes (Signed)
TTS in room waiting on consult.

## 2014-04-27 NOTE — ED Notes (Signed)
Pt up and wanting to leave. Sitter talking to pt reassuring her & reminding her she is going to be [facility]. Pt sitting in chair in doorway.

## 2014-04-27 NOTE — BH Assessment (Signed)
Tele Assessment Note   Toni Everett is an 80 y.o. female.  Per ED notes patient presents to the ER for evaluation of agitation. The patient reportedly has recently been diagnosed with Alzheimer's dementia. She became acutely agitated at home, started hitting objects with her cane. Police were called and reported that she was stating that she wanted to kill herself and kill her family member that was there. They were unable to get her to be cooperative initially. EMS was called and were able to calm her down. He reports, however, that has refused transportation to the year, she became extremely agitated again, swinging and punching, kicking and spitting. She cannot provide any information arrival. She is yelling continuously about having seen babies [place].   Writer met with patient briefly to conduct a tele assessment. Pt was not at all cooperative with the assessment. Pt appeared to be in distress about her cat. She was yelling and screaming at staff. Sts, "I know you took my cat give it back". Staff were present trying their best efforts to console patient. She became increasingly irritated and would not respond to this writers comments and/or questions. Writer heard patient state, "I know I'm next to die to go ahead and let me die life my cat". Writer tried again to ask questions but patient appeared fixated on her cat. Patient started to cry hysterically, therefore; this writer ended the tele assessment. Writer contacted patient's son Toni Everett for collateral information.   Collateral Information from son Toni Everett) [phone]: Son sts that his mom was recently diagnosed with dementia. She lives alone in a trailer. She is ambulutary but son sts she is somewhat unsteady on her feet. She has trouble walking and bending. She also most of the time has incontinence issues. Son lives in a trailer directly behind his mother and he is able to monitor her frequently  throughout the day. Pt has a PCP-Dr. Laurance Flatten @ [facility] in [place]. Sts that his mother is non compliant with medications. However, in the past month he has monitored his mother's medication management. Says that mom has now become compliant with medications x1 month with his help. She has no outpatient mental health providers. She has no history of mental health treatment or hospitalizations.  Sts that patient's behaviors started yrs ago with "pin rolling hand movements and pouting". Sts that over the course of several months he has witnessed cursing, screaming, crying, opening/locking doors obsessively, and wandering away from the home. Son has put a gate around her home to keep patient from wondering in the street. Sts that approx. 1 month ago he noticed that his mom stopped talking for various periods of times. Over the past week he has noticed that patient speaks of aliens, children in the storage home, and imaginary friends. Sts that the imaginary friend is a little girl. Says that he has witnessed patient having full conversations with a "little girl". Says that mom asked him Monday, "How do you like the new rims on your car". Son denies that he has rims on his car and sts that his mom rambled about his rims for 2-3 days. Son feels that his mother is specifically agitated with him.  Yesterday, patient locked herself in the home refusing to allow son to come in the home. Son found an extra set of keys and entered the home. Sts that upon entering the home his mom kept referring to him as her spouse. Son sts that  her spouse (his father) passed away in Mar 05, 1999 from bone cancer. Sts that she eventually calmed down and then tried to leave the premises. Sts that he tied to stop her and talk her into calm down but she became increasingly upset.  Patient became irritable hitting son, flowers, bushes, gate around home, and sons windows at his home with a cane. Pt's son sts that he was unable to calm his mother  down so called his sister. The sister arrived to the home promptly and patient became increasingly irritable. Sister called 66 for assistance.     Writer ran patient by Toni Purpura, NP and [facility] hospitalization was recommended.  Per patient's son upon discharge from the [facility] she is not to return home. Son is making plans to have patient placed at [facility] in [place], [place]. Sts that a FL2 was completed by patient's PCP. He also has a SW with DSS involved in the placement process.   Axis I: Dementia of the Alzheimer's Type, With Early Onset, With Delusions Axis II: Deferred Axis III:  Past Medical History  Diagnosis Date  . IBS (irritable bowel syndrome)   . Nicotine addiction   . Diverticulosis   . Osteopenia   . Elevated blood pressure   . Colon polyps   . Inflammatory polyps of colon with rectal bleeding   . Gastritis   . Hypertension   . Hyperlipidemia   . Alzheimer's dementia    Axis IV: other psychosocial or environmental problems, problems related to social environment and problems with access to health care services Axis V: 31-40 impairment in reality testing  Past Medical History:  Past Medical History  Diagnosis Date  . IBS (irritable bowel syndrome)   . Nicotine addiction   . Diverticulosis   . Osteopenia   . Elevated blood pressure   . Colon polyps   . Inflammatory polyps of colon with rectal bleeding   . Gastritis   . Hypertension   . Hyperlipidemia   . Alzheimer's dementia     History reviewed. No pertinent past surgical history.  Family History: No family history on file.  Social History:  reports that she quit smoking about 15 years ago. She does not have any smokeless tobacco history on file. She reports that she does not drink alcohol or use illicit drugs.  Additional Social History:  Alcohol / Drug Use Pain Medications: SEE MAR Prescriptions: SEE MAR Over the Counter: SEE MAR History of alcohol / drug use?: No history of alcohol /  drug abuse  CIWA: CIWA-Ar BP: 132/64 mmHg Pulse Rate: 80 COWS:    Allergies:  Allergies  Allergen Reactions  . Alcohol-Sulfur [Sulfur]     Patient is intolerant to all alcohol base medicines   . Astelin   . Baycol [Cerivastatin Sodium]   . Cholinesterase Inhibitors   . Clolar [Clofarabine]   . Codeine   . Conj Estrog-Medroxyprogest Ace   . Diovan [Valsartan]   . Fexofenadine Hcl   . Lipitor [Atorvastatin Calcium]   . Lovaza [Omega-3-Acid Ethyl Esters]   . Niaspan [Niacin]   . Norvasc [Amlodipine Besylate]   . Uniretic [Moexipril-Hydrochlorothiazide]     Home Medications:  (Not in a hospital admission)  OB/GYN Status:  No LMP recorded. Patient is postmenopausal.  General Assessment Data Location of Assessment: [facility] Is this a Tele or Face-to-Face Assessment?: Tele Assessment Is this an Initial Assessment or a Re-assessment for this encounter?: Initial Assessment Living Arrangements: Alone Can pt return to current living  arrangement?: Yes Admission Status: Voluntary Is patient capable of signing voluntary admission?: Yes Transfer from: [facility] Referral Source: Self/Family/Friend     [facility] Living Arrangements: Alone Name of Psychiatrist:  (No psychiatrist ) Name of Therapist:  (No therapist )  Education Status Is patient currently in school?: No  Risk to self Suicidal Ideation: No (unable to confirm or deny; but makes statements about dying) Suicidal Intent: No Is patient at risk for suicide?: No Suicidal Plan?: No Access to Means: No What has been your use of drugs/alcohol within the last 12 months?:  (none reported ) Previous Attempts/Gestures: No How many times?:  (n/a) Other Self Harm Risks:  (n/a) Triggers for Past Attempts: Other (Comment) (no previous attempts) Intentional Self Injurious Behavior: None Family Suicide History: No Recent stressful life event(s): Other (Comment) (no stressors reported ) Persecutory  voices/beliefs?: No Depression: No (patient does not respond to this question) Depression Symptoms:  (n/a) Substance abuse history and/or treatment for substance abuse?: No Suicide prevention information given to non-admitted patients: Not applicable  Risk to Others Homicidal Ideation: No (Pt would not confirm or deny) Thoughts of Harm to Others: No Current Homicidal Intent: No Current Homicidal Plan: No Access to Homicidal Means: No Identified Victim:  (n/a) History of harm to others?: No Assessment of Violence: None Noted Violent Behavior Description:  (patient is calm and cooperative) Does patient have access to weapons?: No Criminal Charges Pending?: No Does patient have a court date: No  Psychosis Hallucinations: None noted;Auditory;Visual (Does not confirm or deny; responding to internal/external st) Delusions: Grandiose Civil engineer, contracting of ideas; Talks about random subjects)  Mental Status Report Appear/Hygiene: Disheveled Eye Contact: Good Motor Activity: Freedom of movement Speech: Logical/coherent Level of Consciousness: Alert Mood: Anxious;Irritable;Preoccupied Affect: Appropriate to circumstance Anxiety Level: Severe Thought Processes: Circumstantial;Flight of Ideas Judgement: Impaired Orientation: Person;Place;Situation;Time Obsessive Compulsive Thoughts/Behaviors: None  Cognitive Functioning Concentration: Decreased Memory: Recent Impaired;Remote Impaired IQ: Average Insight: Poor Impulse Control: Poor Appetite: Poor (Son sts that son eats very small portions) Weight Loss:  (son has not noticed significant wt. changes ) Weight Gain:  (none reported ) Sleep: Decreased Total Hours of Sleep:  (per son pt paces all day and night ) Vegetative Symptoms: None  ADLScreening [facility]) Patient's cognitive ability adequate to safely complete daily activities?: No (Pt dx's with dementia. She appears to have poor insight and judgement. She also displays  delusional behaviors. ) Patient able to express need for assistance with ADLs?: Yes (Pt is unsteady on her feet at times. She has trouble bending to put on shoes/socks. ) Independently performs ADLs?: Yes (appropriate for developmental age)  Prior Inpatient Therapy Prior Inpatient Therapy: No Prior Therapy Dates:  (n/a) Prior Therapy Facilty/Provider(s):  (n/a) Reason for Treatment:  (n/a)  Prior Outpatient Therapy Prior Outpatient Therapy: No Prior Therapy Dates:   (n/a) Prior Therapy Facilty/Provider(s):  (n/a) Reason for Treatment:  (n/a)  ADL Screening (condition at time of admission) Patient's cognitive ability adequate to safely complete daily activities?: No (Pt dx's with dementia. She appears to have poor insight and judgement. She also displays delusional behaviors. ) Is the patient deaf or have difficulty hearing?: Yes (Pt had difficulty hearing this Probation officer during the telepsych. She appears somewhat hard of hearing. ) Does the patient have difficulty seeing, even when wearing glasses/contacts?: No Does the patient have difficulty concentrating, remembering, or making decisions?: Yes (Pt dx's with dementia.) Patient able to express need for assistance with ADLs?: Yes (Pt is unsteady on her  feet at times. She has trouble bending to put on shoes/socks. ) Does the patient have difficulty dressing or bathing?:  (Pt has difficulty bending to put on socks and shoes. She is otherwise able to dress herself independently. ) Independently performs ADLs?: Yes (appropriate for developmental age) Does the patient have difficulty walking or climbing stairs?: Yes Weakness of Legs: Both Weakness of Arms/Hands: None  Home Assistive Devices/Equipment Home Assistive Devices/Equipment: Cane (specify quad or straight)    Abuse/Neglect Assessment (Assessment to be complete while patient is alone) Physical Abuse: Denies Verbal Abuse: Denies Sexual Abuse: Denies Exploitation of patient/patient's  resources: Denies Self-Neglect: Denies Values / Beliefs Cultural Requests During Hospitalization: None Spiritual Requests During Hospitalization: None   Advance Directives (For Healthcare) Advance Directive:  (Son sts that he is POA of his mother.) Nutrition Screen- [facility] Adult/[facility] Patient's home diet: Regular (Son prepares meals for mother. Patient eats regular meals (small protions). )  Additional Information 1:1 In Past 12 Months?: No CIRT Risk: No Elopement Risk: No Does patient have medical clearance?: Yes     Disposition:  Disposition Initial Assessment Completed for this Encounter: Yes Disposition of Patient: Inpatient treatment program;Referred to [facility] for placement) Type of inpatient treatment program: Adult Patient referred to: Other (Comment) ([facility] facilities)  Evangeline Gula 04/27/2014 10:57 AM

## 2014-04-27 NOTE — BH Assessment (Signed)
Referrals faxed to [facility] and [facility].  Dossie Arbour-Lyle Leisner, MA  Disposition MHT

## 2014-04-27 NOTE — ED Notes (Signed)
Pt ambulated to restroom w/ sitter at side.

## 2014-04-27 NOTE — ED Notes (Signed)
Pt tearful and agitated at this time asking staff to please see about her animals at home. MD made aware. Awaiting telepysch assessment.

## 2014-04-27 NOTE — ED Notes (Signed)
Pt hitting and yelling at staff, stating "im gonna get out of here".

## 2014-04-27 NOTE — ED Notes (Signed)
PT became very agitated and slammed her door several times yelling at staff accusing staff of killing her cats. PT very tearful and security sitting with pt at this time.

## 2014-04-28 NOTE — ED Notes (Signed)
Pt yelling, fighting w/ staff, stating she is going to leave & that she wants her dog back. Pt calling out for Dr Christell ConstantMoore. Pt saying she does not want to die. Pt is being aggressive to all staff.

## 2014-04-28 NOTE — ED Notes (Signed)
Pt taken to shower under security and NT supervision.

## 2014-04-28 NOTE — ED Notes (Signed)
Pt resting calmly w/ eyes closed. Rise & fall of the chest noted.  Bed in low position, side rails up x2. NAD noted at this time. Sitter remains in site of pt.

## 2014-04-28 NOTE — ED Notes (Signed)
Pt daughter called and asked for an update on pt. Pt daughter informed that pt calm and cooperative today and that pt is still awaiting placement.

## 2014-04-28 NOTE — ED Notes (Signed)
Pt continues to be aggressive despite staff efforts to calm. Pt having to been held in bed both arms & legs to keep from swinging & kicking at staff. EDP notified. Order given.

## 2014-04-28 NOTE — ED Notes (Addendum)
Pt ambulated to bathroom under supervision of sitter with steady gait. nad noted. Meal tray placed in pt room. Pt tolerating breakfast well. Pt calm and cooperative.

## 2014-04-28 NOTE — ED Notes (Signed)
Pt ambulated to bathroom accompanied by sitter, w/o any difficulties.

## 2014-04-28 NOTE — BH Assessment (Signed)
Per Ollen GrossMarjorie at [facility], at capacity. [facility] - Left message regarding geri bed status.  Toni Arbour-Kalynne Womac, MA  Disposition MHT

## 2014-04-28 NOTE — ED Notes (Signed)
Pt easily arousable. Pt calm and cooperative once awake. Pt alert and sitting upright for med admin.

## 2014-04-28 NOTE — ED Notes (Signed)
Meal tray given. nad noted. 

## 2014-04-28 NOTE — BH Assessment (Signed)
Pt was declined at [facility] because insurance was out of network, per Goldman SachsMelinda. Per James[facility] So[facility]hyllis [facility], declined due to acuity level.  -Dossie ArbourAnthony Sahory Nordling, MA   Disposition MHT

## 2014-04-29 ENCOUNTER — Encounter (HOSPITAL_COMMUNITY): Payer: Self-pay | Admitting: Psychiatry

## 2014-04-29 MED ORDER — POTASSIUM CHLORIDE CRYS ER 20 MEQ PO TBCR
40.0000 meq | EXTENDED_RELEASE_TABLET | Freq: Once | ORAL | Status: AC
  Administered 2014-04-29: 40 meq via ORAL
  Filled 2014-04-29: qty 2

## 2014-04-29 NOTE — BH Assessment (Signed)
[facility] requested a TP on this patient. Writer contacted Nanine MeansJamison Lord, NP to make aware. Sts she will see patient later today.

## 2014-04-29 NOTE — Progress Notes (Signed)
Patient ID: Toni Everett, female   DOB: 02/06/1928, 80 y.o.   MRN: [id number]  Attempted to contact [facility] on 3 separate occasions to schedule tele-psych. The phone was not answered on 2 attempts, on the third, call was left on hold.  Will call the ED back in 30 minutes to schedule tele-psych for this patient.  Alberteen SamFran Runa Whittingham, FNP-BC

## 2014-04-29 NOTE — ED Notes (Signed)
Pt ambulated to bathroom w/assistance from staff, and returned to room and laid down w/o any difficulty.

## 2014-04-29 NOTE — ED Notes (Signed)
Myself and Security, Kathlene NovemberMike took pt to garden area for about 10 minutes. Pt was cooperative and appreciative.

## 2014-04-29 NOTE — ED Notes (Signed)
Pt more active and wanting to come out room. Cleaning room. Sitter at bedside. Pt still cooperative at this time.

## 2014-04-29 NOTE — ED Notes (Signed)
Patient ambulating around ED with sitter at side. She remains calms and cooperative.

## 2014-04-29 NOTE — ED Provider Notes (Signed)
Pt currently awaiting placement No acute issues at this time Labs reviewed BP 116/65  Pulse 86  Temp(Src) 98.2 F (36.8 C) (Oral)  Resp 16  SpO2 97%   Joya Gaskinsonald W Clothilde Tippetts, MD 04/29/14 [phone]

## 2014-04-29 NOTE — ED Notes (Signed)
Pt is asleep. Potassium will be administered with her daily medication scheduled in the AM.

## 2014-04-29 NOTE — ED Notes (Signed)
Toni Everett of behaviorl

## 2014-04-29 NOTE — ED Notes (Signed)
Pt advised that her son is taking care of them. Pt grimaced and said "i dont have a son". Pt reorietned. nad

## 2014-04-29 NOTE — ED Notes (Signed)
Pt daughter called and asked for an update on pt. Pt daughter given an update and requested to wanted to talk with mother. Pt became tearful and not able to talk to daughter at this time. Pt daughter reported would call back tomorrow and check on her mother.

## 2014-04-29 NOTE — BH Assessment (Signed)
Contacted provider to inquire about a ETA of when patient would be evaluated. No answer, therefore; this writer will continue trying to follow up as this patient still needs a telepsych.

## 2014-04-29 NOTE — ED Notes (Signed)
Toni Everett of Behavioral Health phoned to perform tele-psych but patient was asleep. States that it could be done at a later time when she was not sleeping. Will pass this on to next shift.

## 2014-04-30 ENCOUNTER — Emergency Department (HOSPITAL_COMMUNITY): Payer: Medicare Other

## 2014-04-30 LAB — POTASSIUM: POTASSIUM: 3.9 meq/L (ref 3.7–5.3)

## 2014-04-30 MED ORDER — RISPERIDONE 0.5 MG PO TABS
0.2500 mg | ORAL_TABLET | Freq: Every day | ORAL | Status: DC
  Filled 2014-04-30: qty 0.5
  Filled 2014-04-30: qty 1

## 2014-04-30 MED ORDER — LORAZEPAM 2 MG/ML IJ SOLN
0.5000 mg | Freq: Once | INTRAMUSCULAR | Status: AC
  Administered 2014-04-30: 0.5 mg via INTRAMUSCULAR

## 2014-04-30 MED ORDER — LOPERAMIDE HCL 2 MG PO CAPS
2.0000 mg | ORAL_CAPSULE | ORAL | Status: DC | PRN
  Administered 2014-04-30: 2 mg via ORAL
  Filled 2014-04-30: qty 1

## 2014-04-30 NOTE — ED Notes (Signed)
Patient up to restroom with sitter at side. No distress.

## 2014-04-30 NOTE — ED Provider Notes (Signed)
No acute issues Pt awaiting placement Pt to have telepsych performed once awake   Joya Gaskinsonald W Bashar Milam, MD 04/30/14 [phone]

## 2014-04-30 NOTE — Progress Notes (Signed)
Pt's [facility] [place] [facility].  *Pt has been declined at [facility].  The following facilities have gero beds but do not take pts with a primary dx of dementia: [facility]- per [facility]- [facility]- per [facility]    Quantisha Marsicano Disposition MHT

## 2014-04-30 NOTE — Progress Notes (Signed)
MHT received a call from [facility] stated only received EKG paperwork.  MHT called and spoke to SpringerBelinda, RN to let know needed IVC, and 1st examination faxed and potassium labs ordered for pt pending acceptance [facility].  Paperwork refaxed to include chest xray, IVC, 1st examination and potassium labs and confirmation received with [facility].  Donnella ShamMichelle L Ayleah Hofmeister,MHT/NS

## 2014-04-30 NOTE — Progress Notes (Addendum)
Per Toni NeptuneAngela Everett pt accepted to [facility] pending receiving the result of pts chest x-ray, EKG, and IVC paper work.  Spoke with pts RN, Toni Everett,to give update.  All requested documentation will be faxed once completed.  1900 As requested EKG, findings from chest x-ray and IVC paper work has been faxed.   Toni [facility]rett from [facility] has called back and states that Dr. Saralyn Everett will accept if pt can also get K+ before leaving d/t it being "a little" low.  Will pass along to oncoming shift new request.    Toni BambergerMariya Corley Everett Disposition MHT

## 2014-04-30 NOTE — ED Notes (Signed)
Patient to shower with sitter 

## 2014-04-30 NOTE — Consult Note (Signed)
Telepsych Consultation   Reason for Consult:  Psychiatric re-evaluation Referring Physician:  EDP  Toni Everett is an 80 y.o. female.  Assessment: AXIS I:  Dementia of Alzheimer's Type with Delusions AXIS II:  Deferred AXIS III:   Past Medical History  Diagnosis Date  . IBS (irritable bowel syndrome)   . Nicotine addiction   . Diverticulosis   . Osteopenia   . Elevated blood pressure   . Colon polyps   . Inflammatory polyps of colon with rectal bleeding   . Gastritis   . Hypertension   . Hyperlipidemia   . Alzheimer's dementia    AXIS IV:  other psychosocial or environmental problems AXIS V:  21-30 behavior considerably influenced by delusions or hallucinations OR serious impairment in judgment, communication OR inability to function in almost all areas  Plan:  Recommend psychiatric Inpatient admission when medically cleared.  Subjective:   Toni Everett is a 80 y.o. female patient who presented to [facility] ED on 04/27/14 for evaluation of agitation. The patient reportedly was recently diagnosed with Alzheimer's dementia. She became acutely agitated at home, started hitting objects with her cane. Police were called and reported that she was stating that she wanted to kill herself and kill her family member that was there.  Today, patient denies hitting objects with a cane and states "I don't have a cane." Patient states she lives alone.  Patient is alert to person only; she does not know the month, year, her location or current president. Patient was asked what her son's name is and she stated Richardson Dopp'Cole'; son's name is Tarri FullerDavid Browning.  Patient currently denies SI or HI stating "I would never want to hurt myself or others."  When asked if she sees things, patient begins talking about "seeing babies nobody wants to help. Nobody wants them. I would take them if I could." Patient becomes upset and tearful when discussing this.   Agree with initial patient assessment that was completed  on 04/26/14.  Placement in a geri-psych facility is recommended for Toni Everett.  Per previous assessment, patient is not to return home. Her son is making plans to have her placed at [facility] in [place], [place].   HPI:  80 y.o. female patient who presented to [facility] ED on 04/27/14 for evaluation of agitation. HPI Elements:   Location:  Mood. Quality:  Forgetful and intermittent hallucinations. Severity:  Moderate to severe. Timing:  Daily. Duration:  Daily. Context:  Stressors.  Past Psychiatric History: Past Medical History  Diagnosis Date  . IBS (irritable bowel syndrome)   . Nicotine addiction   . Diverticulosis   . Osteopenia   . Elevated blood pressure   . Colon polyps   . Inflammatory polyps of colon with rectal bleeding   . Gastritis   . Hypertension   . Hyperlipidemia   . Alzheimer's dementia     reports that she quit smoking about 15 years ago. She does not have any smokeless tobacco history on file. She reports that she does not drink alcohol or use illicit drugs. History reviewed. No pertinent family history. Family History Substance Abuse: No Family Supports: Yes, List: Living Arrangements: Alone Can pt return to current living arrangement?: Yes Allergies:   Allergies  Allergen Reactions  . Alcohol-Sulfur [Sulfur]     Patient is intolerant to all alcohol base medicines   . Astelin   . Baycol [Cerivastatin Sodium]   . Cholinesterase Inhibitors   . Clolar [Clofarabine]   . Codeine   .  Conj Estrog-Medroxyprogest Ace   . Diovan [Valsartan]   . Fexofenadine Hcl   . Lipitor [Atorvastatin Calcium]   . Lovaza [Omega-3-Acid Ethyl Esters]   . Niaspan [Niacin]   . Norvasc [Amlodipine Besylate]   . Uniretic [Moexipril-Hydrochlorothiazide]     ACT Assessment Complete:  Yes:    Educational Status    Risk to Self: Risk to self Suicidal Ideation: No (unable to confirm or deny; but makes statements about dying) Suicidal Intent: No Is patient at risk for  suicide?: No Suicidal Plan?: No Access to Means: No What has been your use of drugs/alcohol within the last 12 months?:  (none reported ) Previous Attempts/Gestures: No How many times?:  (n/a) Other Self Harm Risks:  (n/a) Triggers for Past Attempts: Other (Comment) (no previous attempts) Intentional Self Injurious Behavior: None Family Suicide History: No Recent stressful life event(s): Other (Comment) (no stressors reported ) Persecutory voices/beliefs?: No Depression: No (patient does not respond to this question) Depression Symptoms:  (n/a) Substance abuse history and/or treatment for substance abuse?: No Suicide prevention information given to non-admitted patients: Not applicable  Risk to Others: Risk to Others Homicidal Ideation: No (Pt would not confirm or deny) Thoughts of Harm to Others: No Current Homicidal Intent: No Current Homicidal Plan: No Access to Homicidal Means: No Identified Victim:  (n/a) History of harm to others?: No Assessment of Violence: None Noted Violent Behavior Description:  (patient is calm and cooperative) Does patient have access to weapons?: No Criminal Charges Pending?: No Does patient have a court date: No  Abuse: Abuse/Neglect Assessment (Assessment to be complete while patient is alone) Physical Abuse: Denies Verbal Abuse: Denies Sexual Abuse: Denies Exploitation of patient/patient's resources: Denies Self-Neglect: Denies  Prior Inpatient Therapy: Prior Inpatient Therapy Prior Inpatient Therapy: No Prior Therapy Dates:  (n/a) Prior Therapy Facilty/Provider(s):  (n/a) Reason for Treatment:  (n/a)  Prior Outpatient Therapy: Prior Outpatient Therapy Prior Outpatient Therapy: No Prior Therapy Dates:   (n/a) Prior Therapy Facilty/Provider(s):  (n/a) Reason for Treatment:  (n/a)  Additional Information: Additional Information 1:1 In Past 12 Months?: No CIRT Risk: No Elopement Risk: No Does patient have medical clearance?: Yes     Objective: Blood pressure 123/46, pulse 66, temperature 97.6 F (36.4 C), temperature source Oral, resp. rate 20, SpO2 100.00%.There is no weight on file to calculate BMI.No results found for this or any previous visit (from the past 72 hour(s)).  Labs are reviewed. No lab results available for 04/30/14. Last labs completed on 04/26/14.   Current Facility-Administered Medications  Medication Dose Route Frequency Provider Last Rate Last Dose  . lisinopril (PRINIVIL,ZESTRIL) tablet 20 mg  20 mg Oral Daily Gilda Crease, MD   20 mg at 04/29/14 1013   And  . hydrochlorothiazide (MICROZIDE) capsule 12.5 mg  12.5 mg Oral Daily Gilda Crease, MD   12.5 mg at 04/29/14 1013  . LORazepam (ATIVAN) injection 0.5 mg  0.5 mg Intramuscular Q6H PRN Gilda Crease, MD   0.5 mg at 04/27/14 1909  . risperiDONE (RISPERDAL) tablet 0.25 mg  0.25 mg Oral QHS Kristeen Mans, NP       Current Outpatient Prescriptions  Medication Sig Dispense Refill  . diphenoxylate-atropine (LOMOTIL) 2.5-0.025 MG per tablet Take 1 tablet by mouth daily.  30 tablet  1  . lisinopril-hydrochlorothiazide (PRINZIDE,ZESTORETIC) 20-12.5 MG per tablet Take 1 tablet by mouth  every day for blood  pressure  90 tablet  3    Psychiatric Specialty Exam:  Blood pressure 123/46, pulse 66, temperature 97.6 F (36.4 C), temperature source Oral, resp. rate 20, SpO2 100.00%.There is no weight on file to calculate BMI.  General Appearance: Fairly Groomed  Patent attorneyye Contact::  Poor  Speech:  Slow  Volume:  Decreased  Mood:  Euthymic  Affect:  Congruent  Thought Process:  Disorganized  Orientation:  Other:  Oriented to person only  Thought Content:  Delusions  Suicidal Thoughts:  No  Homicidal Thoughts:  No  Memory:  Immediate;   Poor Recent;   Poor Remote;   Poor  Judgement:  Poor  Insight:  Lacking  Psychomotor Activity:  Normal  Concentration:  Poor  Recall:  Poor  Akathisia:  NA  Handed:  Right  AIMS (if  indicated):     Assets:  Social Support  Sleep:      Treatment Plan Summary: 1. Admit for crisis management and stabilization. 2. Daily contact with patient to assess and evaluate symptoms and progress in treatment 3. Medication management to reduce symptoms to baseline and improve the patient's overall level of functioning. Closely monitor for side effects, efficacy and therapeutic response to medication. Will start Risperdal 0.25 mg PO QHS  Disposition: Psychiatric inpatient admission is recommended for this patient.  Patient referrals have been sent to geri-psych facilities. Dr. Bebe ShaggyWickline notified that patient is still pending placement at an appropriate facility.   Toni SamFran Esau Fridman, FNP-BC 04/30/2014 6:19 AM

## 2014-05-01 NOTE — ED Provider Notes (Signed)
Patient has been accepted at [facility] by Dr. Veverly FellsPolombo.   Dione Boozeavid Riyanshi Wahab, MD 05/01/14 [phone]

## 2014-05-01 NOTE — Progress Notes (Signed)
MHT spoke with Bonita QuinLinda, RN at [facility], pt has been accepted to Dr. Saralyn PilarPolumbo.  RN report#[phone].  Pt can arrive at any time as long as not during shift report time between 600-700.  MHT called an informed Lawson FiscalLori, Charity fundraiserN at [facility].  Blain PaisMichelle L Jerri Hargadon, MHT/NS

## 2014-05-01 NOTE — Progress Notes (Signed)
MHT was contacted by [facility] requesting if pt on oxygen and new set of vitals.  MHT spoke with Lawson FiscalLori, RN and faxed to [facility].  Blain PaisMichelle L Lomax Poehler, MHT/NS

## 2014-05-02 ENCOUNTER — Ambulatory Visit: Payer: Medicare Other | Admitting: Neurology

## 2014-05-05 NOTE — Consult Note (Signed)
Patient is diagnosed with Alzheimer's dementia, gets agitated easily, appear psychotic and needs inpatient hospitalization for stabilization.

## 2014-06-21 IMAGING — CT CT HEAD W/O CM
1 of 2 series · 13 of 30 positions shown, 17 images · non-contrast
Comparison: None.

CLINICAL DATA: L family disease, agitation, combative

EXAM:
CT HEAD WITHOUT CONTRAST
TECHNIQUE: Contiguous axial images were obtained from the base of the skull
through the vertex without intravenous contrast.

[Series 2: headseq 4.8 h37s · axial · 0.43mm/px · z∈[+134,+258]mm · 13 of 30 slices shown, 17 images]
[im 3/30  brain]
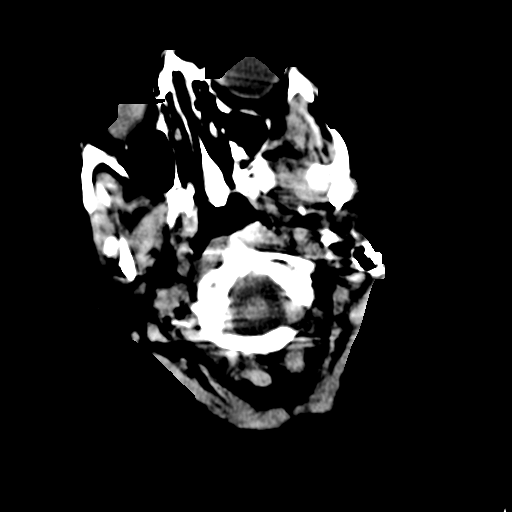
[im 3/30  bone]
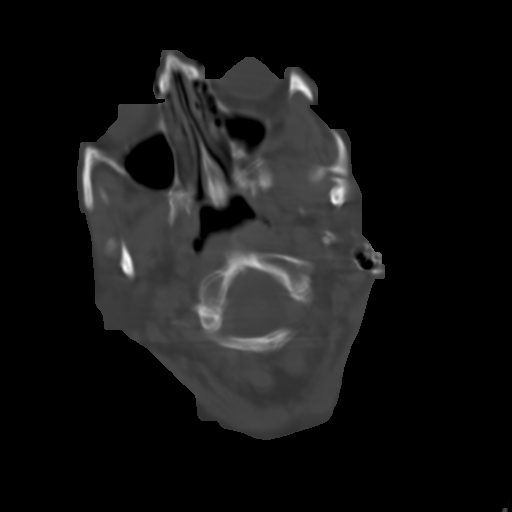
[im 5/30  brain]
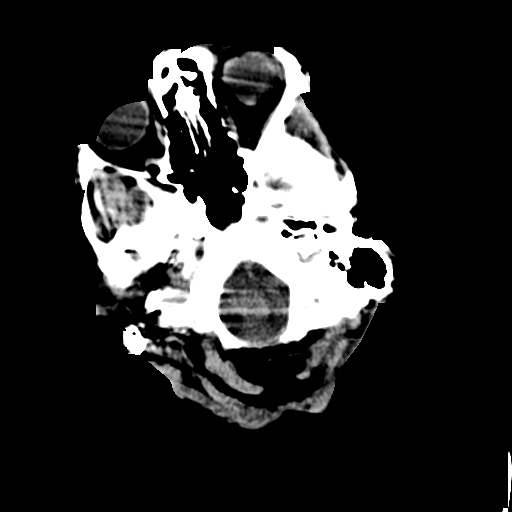
[im 7/30  brain]
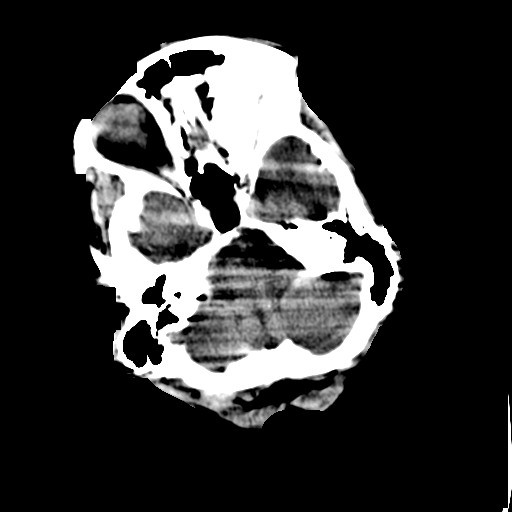
[im 9/30  brain]
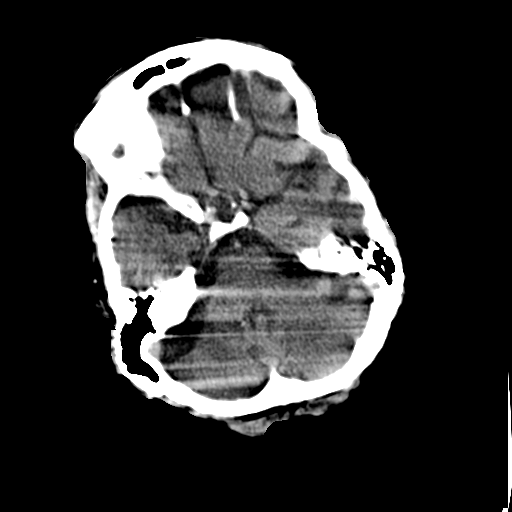
[im 11/30  brain]
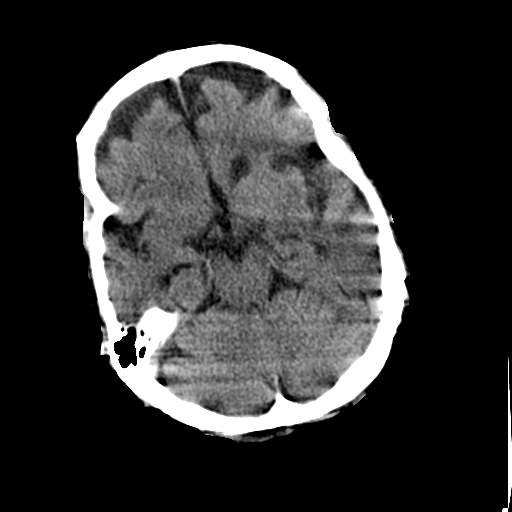
[im 11/30  bone]
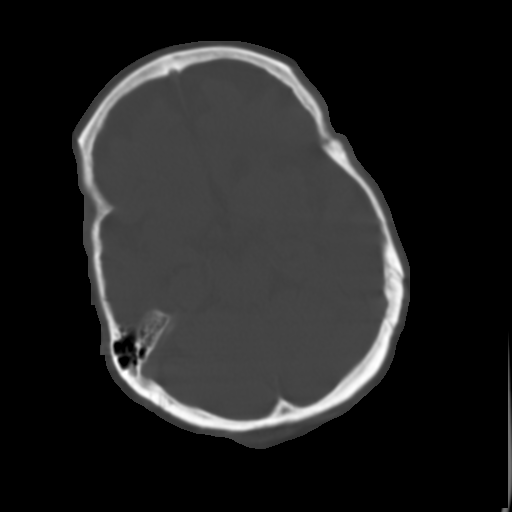
[im 13/30  brain]
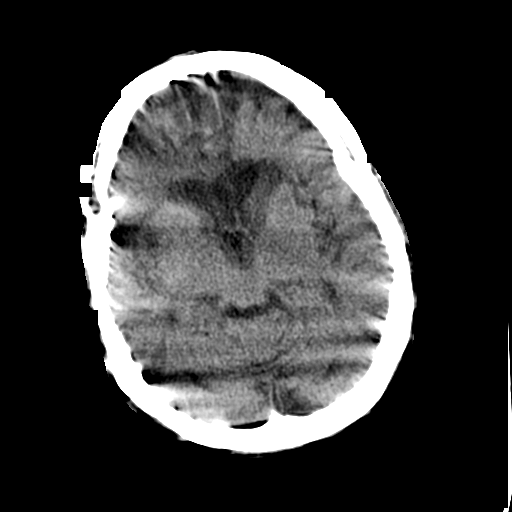
[im 15/30  brain]
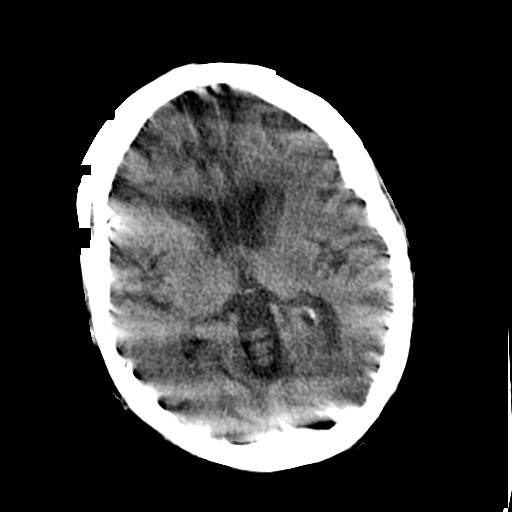
[im 17/30  brain]
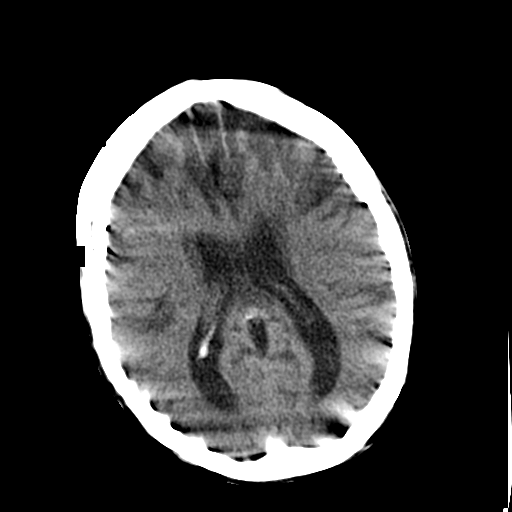
[im 19/30  brain]
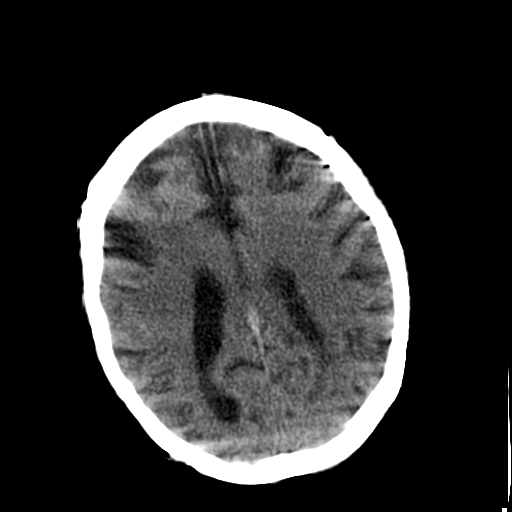
[im 19/30  bone]
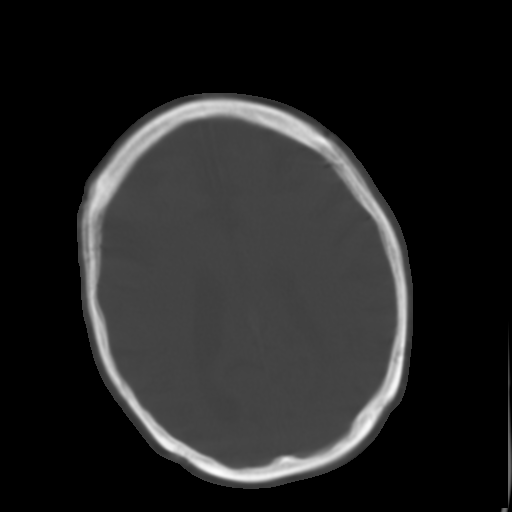
[im 21/30  brain]
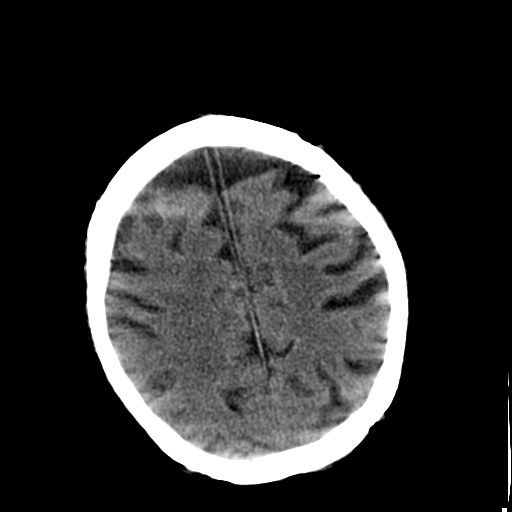
[im 23/30  brain]
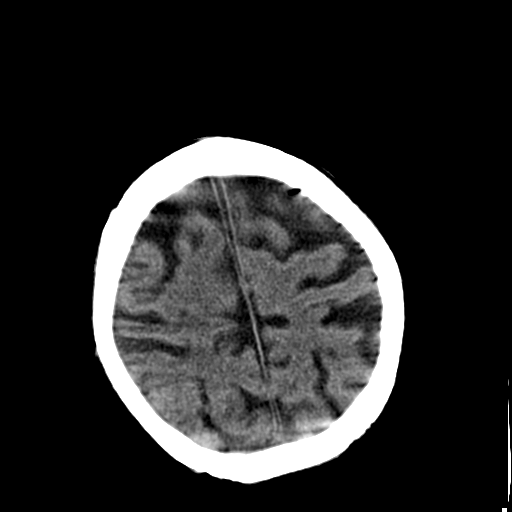
[im 25/30  brain]
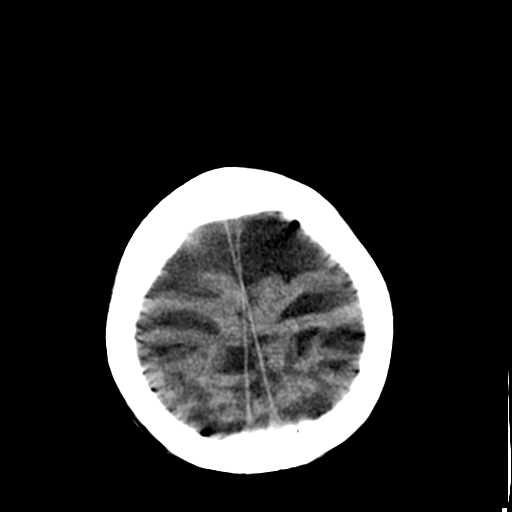
[im 27/30  brain]
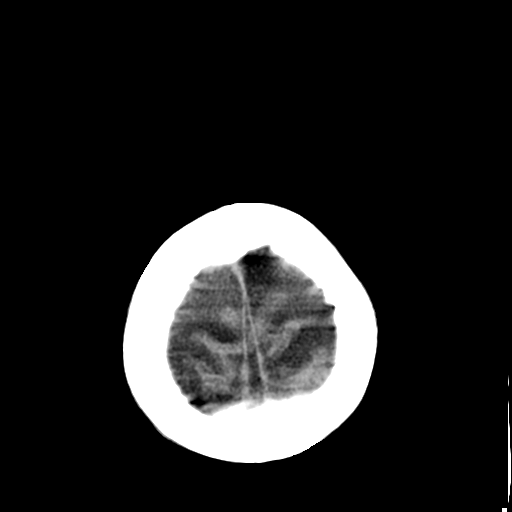
[im 27/30  bone]
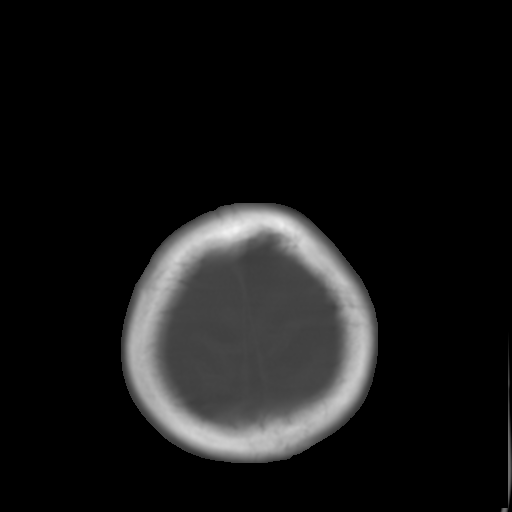

[13 of 30 positions shown; findings below may reference images not displayed]

FINDINGS: Study is mildly limited by motion artifact despite repeating the
study. There is no hemorrhage or extra-axial fluid. There is no
evidence of infarct or mass. There is no hydrocephalus. There is
moderate to severe diffuse atrophy. There is mild low attenuation in
the deep white matter. The calvarium is intact. No significant
inflammatory change in the visualized portions of the paranasal
sinuses.
IMPRESSION: Atrophy with no acute findings

## 2014-06-25 IMAGING — CR DG CHEST 1V PORT
1 series · 1 of 1 positions shown · non-contrast
Comparison: DG CHEST 2V dated 12/23/2013

CLINICAL DATA: Altered mental status

EXAM:
PORTABLE CHEST - 1 VIEW

[ap]
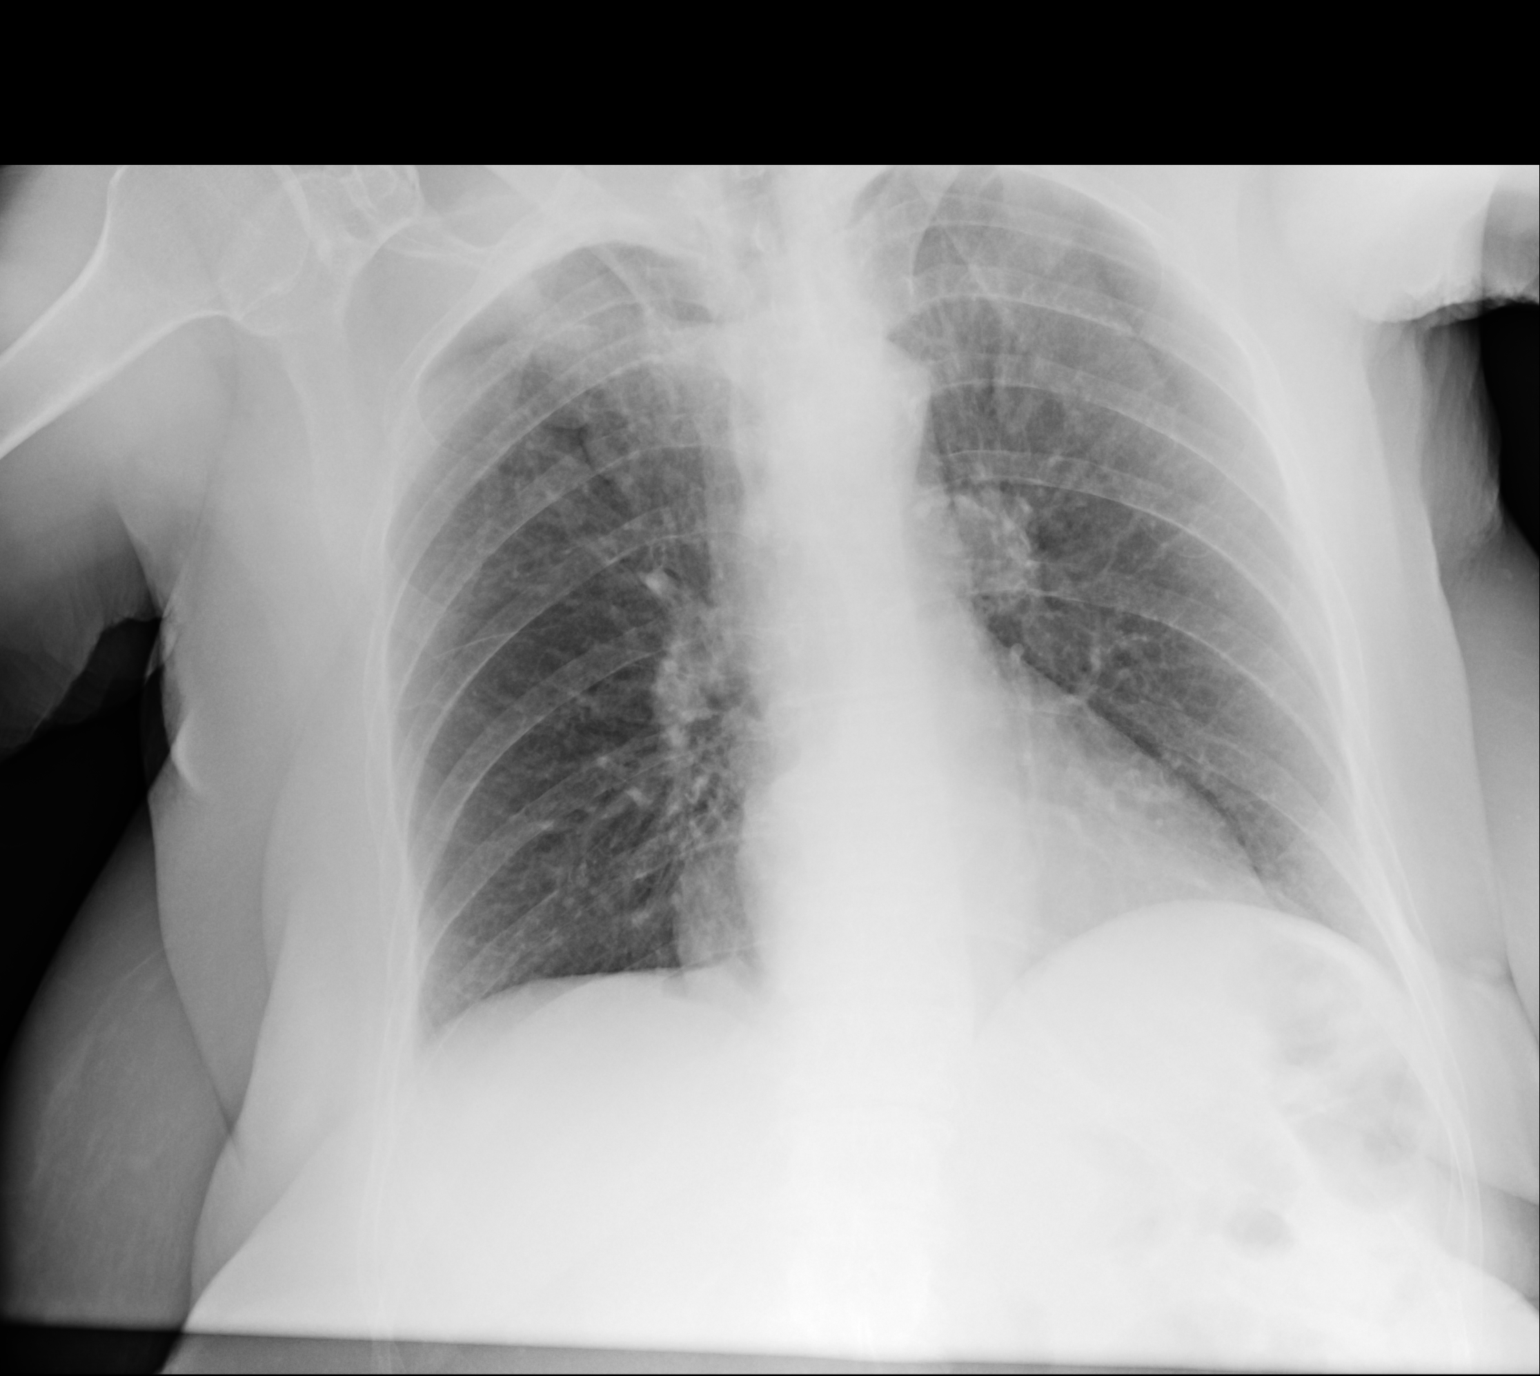

[1 of 1 positions shown; findings below may reference images not displayed]

FINDINGS: Heart size at upper limits of normal. Hiatal hernia reidentified.
Prominence of the reticular markings is noted without focal lobar
consolidation. No pleural effusion. No acute osseous abnormality.
IMPRESSION: Bilateral reticular marking prominence which may be due to relative
hypoaeration but followup at [facility] and lateral chest radiographs at
full inspiration could be helpful when the patient is clinically
able.

## 2014-07-13 ENCOUNTER — Non-Acute Institutional Stay (SKILLED_NURSING_FACILITY): Payer: Medicare Other | Admitting: Internal Medicine

## 2014-07-13 DIAGNOSIS — I10 Essential (primary) hypertension: Secondary | ICD-10-CM

## 2014-07-13 DIAGNOSIS — F02818 Dementia in other diseases classified elsewhere, unspecified severity, with other behavioral disturbance: Secondary | ICD-10-CM

## 2014-07-13 DIAGNOSIS — F0281 Dementia in other diseases classified elsewhere with behavioral disturbance: Secondary | ICD-10-CM

## 2014-07-13 DIAGNOSIS — G219 Secondary parkinsonism, unspecified: Secondary | ICD-10-CM

## 2014-07-15 DIAGNOSIS — G219 Secondary parkinsonism, unspecified: Secondary | ICD-10-CM | POA: Insufficient documentation

## 2014-07-15 DIAGNOSIS — F0281 Dementia in other diseases classified elsewhere with behavioral disturbance: Secondary | ICD-10-CM | POA: Insufficient documentation

## 2014-07-15 DIAGNOSIS — F02818 Dementia in other diseases classified elsewhere, unspecified severity, with other behavioral disturbance: Secondary | ICD-10-CM | POA: Insufficient documentation

## 2014-07-15 NOTE — Progress Notes (Addendum)
Patient ID: Toni Everett, female   DOB: 02/11/1928, 80 y.o.   MRN: [id number]               HISTORY & PHYSICAL  DATE:  07/13/2014    FAC[facility]: [facility]    LEVEL OF CARE:   SNF   CHIEF COMPLAINT:  Admission to SNF, post stay at [facility], Geriatric Psych Unit, 05/01/2014 through 07/08/2014.    HISTORY OF PRESENT ILLNESS:  I am not exactly sure of the circumstances surrounding this lady's transfer to [facility].  She was seen in the ER at [facility] on 04/26/2014, at which time she had agitation and confusion.  The patient was noted on admission to [facility] to have a severe level of disorientation, confusion, and memory loss.  The problem was difficulty controlling her aggressive behavior.  She had multiple trials of medications in an attempt to reduce anger outbursts and oppositional mood swings.  Eventually, they stabilized her.   She also had intercurrent illnesses during the hospitalization, including pneumonia and a diarrheal illness.  I have no specifics on this.    PAST MEDICAL HISTORY/PROBLEM LIST:  Includes:    Hypertension.    Irritable bowel.    Diverticulosis.    Osteopenia.    Colonic polyps.    Hyperlipidemia.    Alzheimer's disease with psychosis.    CURRENT MEDICATIONS:  Discharge medications include:      Albuterol nebulizers 2.5 q.6 p.r.n.     Carvedilol 3.125 b.i.d.    Aricept 10 q.h.s.    Lexapro 20 q.d.    Lorazepam 0.5 daily and every 4 hours p.r.n.      Thiothixene (Navane) 10 mg at bedtime.    Lisinopril/hydrochlorothiazide 20/12.5, 1 p.o. q.d.     SOCIAL HISTORY:  I have no information at this point.  Her power of attorney papers are on the chart naming her son.   CODE STATUS:   She is listed as a Full Code.    FAMILY HISTORY:  Not available from any current source.    REVIEW OF SYSTEMS:  Of marginal value secondary to the patient's dementia.  However, in discussion with the staff, she  apparently can stand to  assist with the transferring.  She does not walk, per se.  She spends most of her time in the wheelchair.  They state that her behavior so far in the facility has been reasonably stable.    PHYSICAL EXAMINATION:   VITAL SIGNS:   O2 SATURATIONS:  94% on room air.   PULSE:  84.   RESPIRATIONS:  18 and unlabored.   GENERAL APPEARANCE:  Frail, elderly woman, although not in any distress.   HEENT:   MOUTH/THROAT:   She would not open her mouth.   CHEST/RESPIRATORY:  Significant thoracic kyphosis.  Reduced air entry, but no wheezing.  No accessory muscle use.   CARDIOVASCULAR:  CARDIAC:   Heart sounds are normal.  There are no murmurs.  No gallops.  She appears to be euvolemic.   GASTROINTESTINAL:  ABDOMEN:   Soft.   LIVER/SPLEEN/KIDNEYS:  No liver, no spleen.  No tenderness.   GENITOURINARY:  BLADDER:   Not distended.  There is no costovertebral angle tenderness.   CIRCULATION:   EDEMA/VARICOSITIES:  Extremities:  She has 2+ edema bilaterally below the knees.  However, no evidence of a DVT.       ARTERIAL:  Probably some degree of PAD with cool feet.     NEUROLOGICAL:  SENSATION/STRENGTH:  She has antigravity strength in both her arms and legs, although she is frail.     DEEP TENDON REFLEXES:  She has increased tone bilaterally in the legs and arms.   PSYCHIATRIC:   MENTAL STATUS:   She is not orientated to the place, year.  She could not tell me where she lived.  She was able, after the fact, to tell me she had three sons.   She was not quite sure where her home is.  There was no evidence of abnormal thought form or content at this point.  I suspect her Alzheimer's disease is moderate to severe, which is roughly stable.  She is depressed-looking and in her thought content.  She told me she "was sorry to have caused so much difficulty".  She is on treatment for this.    ASSESSMENT/PLAN:  Alzheimer's disease with coexistent psychosis.  She is on Navane.     Coexistent depression.   Currently on Lexapro.    Parkinsonism.  Whether this is part of her dementia complex or a  side effect of the thiothixene at this point is unclear.  It would appear that her psychosis is adequately controlled, by discussion with the staff.  I will not change this for the moment.  However, in a week or two if everything is stable, I will begin a withdrawal of this medication.    Hypertension.  We will monitor this.    Significant osteoporosis.    ?Obstructive lung disease.  She was discharged on Albuterol and had pneumonia.  Air entry is reduced, which might reflect obstructive lung disease and/or the significance of her thoracic kyphosis.   I have checked her lab work.  Plan to taper the Navane in two weeks if her behavior remains stable.

## 2014-07-21 ENCOUNTER — Other Ambulatory Visit: Payer: Self-pay | Admitting: Internal Medicine

## 2014-07-21 DIAGNOSIS — R1310 Dysphagia, unspecified: Secondary | ICD-10-CM

## 2014-07-28 ENCOUNTER — Non-Acute Institutional Stay (SKILLED_NURSING_FACILITY): Payer: Medicare Other | Admitting: Internal Medicine

## 2014-07-28 ENCOUNTER — Encounter: Payer: Self-pay | Admitting: Internal Medicine

## 2014-07-28 DIAGNOSIS — R5381 Other malaise: Secondary | ICD-10-CM

## 2014-07-28 DIAGNOSIS — N390 Urinary tract infection, site not specified: Secondary | ICD-10-CM | POA: Insufficient documentation

## 2014-07-28 DIAGNOSIS — R5383 Other fatigue: Secondary | ICD-10-CM | POA: Insufficient documentation

## 2014-07-28 DIAGNOSIS — N10 Acute tubulo-interstitial nephritis: Secondary | ICD-10-CM

## 2014-07-28 NOTE — Progress Notes (Signed)
Patient ID: Toni Junglingatricia A Glendinning, female   DOB: 10/03/1928, 80 y.o.   MRN: [id number]   This is an acute visit.  Level of care skilled.  Fac[facility].  History of present illness.  Patient is an 80 year old female who recently was hospitalized [facility]  for agitation and confusion.  She had aggressive behavior.  Apparently there were multiple trials and attempt to reduce her angry outbursts and oppositional mood swings-initially this stabilized apparently.  She also had apparently pneumonia in a diarrheal illness during her hospitalization.  Apparently during her stay here she did develop some dysuria and a urine culture grew out multiple organisms including Escherichia coli greater than 100,000 colonies-Klebsiella pneumonia 25 50,000 colonies-also 25-50,000 colonies of VRE.  She was started on doxycycline which is effective against all 3 per sensitivities-she apparently had been started on Rocephin but this was discontinued.  She also had a chest x-ray which did not show any acute pathology-lab work also came back fairly unremarkable except for potassium of 3.0 this is being supplemented and per most recent lab has gone up to 4.0.  Her son is in the  facility today and says he appears to be somewhat more lethargic the last several days--.  Her vital signs continued to be stable-ago she did fall over a week ago apparently had fallen as well at [facility]-he did bump the right side of her head-neural checks and vital signs again have been stable.  Medication-wise she does continue on Aricept Lexapro as well as Ativan every 4 hours when necessary as well as Navane.  I did speak with nursing staff who states that  the Ativan at this point is necessary-apparently later in the evening she does get somewhat more excitable--and agitated.  Patient cannot really give any review of systems today.  Family medical social history as been reviewed per admission note  on 07/13/2014.  Medications have been reviewed as noted above.  Review of systems-as noted above again essentially unobtainable secondary to patient's mental status.  Physical exam.  Temperature is 97.5 pulse 70 respirations 14 blood pressure 112/73 weight is 120 always appear to be relatively stable.  In general this is a frail elderly woman in no distress lying comfortably in her Geri chair.  She appears somewhat lethargic but responsive to verbal commands.  Her skin is warm and dry.   eyes-difficult exam since patient was holding  her eyes tightly shut rash it appeared her sclera and conjunctiva are clear pupils were reactive although again this was quite limited  Oropharynx mucous membranes appear moist although she did not open her mouth very wide her tongue appears to be midline with full range of motion.  Chest is clear to auscultation she does have reduced breath sounds with a history of thoracic kyphosis-there is no labored breathing.  Her heart is regular rate and rhythm without murmur gallops or rub.  Her abdomen soft does not appear to be tender there are positive bowel sounds.  GU-I cannot appreciate any rash or drainage or suprapubic tenderness.--  Muscle skeletal she appears able to move all her extremities x4 although she did not follow verbal commands 100% of this was difficult to tell-she has some edema of her lower extremities I suspect this is baseline per previous note.  Neurologic-appears grossly intact her tongue is midline cranial nerves appear intact although getting her to open her eyes was difficult secondary to her closing them tightly with attempt at exam.  She appears able to  move all her extremities x4 again limited exam since patient did not completely follow verbal commands-  Psych she does speak but fairly minimally she does follow verbal commands to some extent.  Labs.  On this 10th 2015.  Sodium 144 potassium 4 BUN 18 creatinine  0.82-magnesium 2.0.  07/21/2014.  Sodium 144 potassium 3 BUN 18 creatinine 0.93.  Liver function tests within normal limits except albumin of 3.3.  WBC 7.4 hemoglobin 12.6 platelets 393   Assessment and plan.  #1-UTI-she is completing a course of doxycycline for multiple organism UTI currently on day 6 of 10-at this point we'll monitor her.  #2-question lethargy-according to nursing staff that is somewhat intermittent-I did speak with her son as well-there is some history of fall here with head trauma-will order  a CT  without   contrast rule out intracranial pathology.  Also will update lab work tomorrow including a CBC with differential and a metabolic panel as well as an ammonia level and TSH.  At this point her vital signs are stable.  Monitor vital signs pulse ox every shift with neuro  Checks  [phone]    .

## 2014-08-02 ENCOUNTER — Other Ambulatory Visit: Payer: Self-pay | Admitting: Internal Medicine

## 2014-08-02 ENCOUNTER — Ambulatory Visit (HOSPITAL_COMMUNITY)
Admission: RE | Admit: 2014-08-02 | Discharge: 2014-08-02 | Disposition: A | Discharge status: Home / Self Care | Payer: Medicare Other | Source: Ambulatory Visit | Attending: Family Medicine | Admitting: Family Medicine

## 2014-08-02 ENCOUNTER — Ambulatory Visit (HOSPITAL_COMMUNITY)
Admission: RE | Admit: 2014-08-02 | Discharge: 2014-08-02 | Disposition: A | Discharge status: Home / Self Care | Payer: Medicare Other | Source: Ambulatory Visit | Attending: Internal Medicine | Admitting: Internal Medicine

## 2014-08-02 ENCOUNTER — Ambulatory Visit: Payer: Medicare Other | Admitting: Family Medicine

## 2014-08-02 DIAGNOSIS — R1310 Dysphagia, unspecified: Secondary | ICD-10-CM

## 2014-08-02 DIAGNOSIS — E785 Hyperlipidemia, unspecified: Secondary | ICD-10-CM | POA: Insufficient documentation

## 2014-08-02 DIAGNOSIS — W19XXXA Unspecified fall, initial encounter: Secondary | ICD-10-CM

## 2014-08-02 DIAGNOSIS — IMO0001 Reserved for inherently not codable concepts without codable children: Secondary | ICD-10-CM

## 2014-08-02 DIAGNOSIS — K589 Irritable bowel syndrome without diarrhea: Secondary | ICD-10-CM | POA: Diagnosis not present

## 2014-08-02 DIAGNOSIS — F0280 Dementia in other diseases classified elsewhere without behavioral disturbance: Secondary | ICD-10-CM | POA: Diagnosis not present

## 2014-08-02 DIAGNOSIS — I10 Essential (primary) hypertension: Secondary | ICD-10-CM | POA: Diagnosis not present

## 2014-08-02 DIAGNOSIS — R1311 Dysphagia, oral phase: Secondary | ICD-10-CM | POA: Diagnosis present

## 2014-08-02 DIAGNOSIS — R1313 Dysphagia, pharyngeal phase: Secondary | ICD-10-CM | POA: Insufficient documentation

## 2014-08-02 DIAGNOSIS — R5383 Other fatigue: Secondary | ICD-10-CM

## 2014-08-02 DIAGNOSIS — G309 Alzheimer's disease, unspecified: Secondary | ICD-10-CM | POA: Insufficient documentation

## 2014-08-02 NOTE — Procedures (Signed)
Objective Swallowing Evaluation: Modified Barium Swallowing Study   Patient Details  Name: Toni Everett MRN: [id number] Date of Birth: [birth year]  Today's Date: 08/02/2014 Time:  -     Past Medical History:  Past Medical History  Diagnosis Date  . IBS (irritable bowel syndrome)   . Nicotine addiction   . Diverticulosis   . Osteopenia   . Elevated blood pressure   . Colon polyps   . Inflammatory polyps of colon with rectal bleeding   . Gastritis   . Hypertension   . Hyperlipidemia   . Alzheimer's dementia    Past Surgical History: No past surgical history on file. HPI:  Ms. Toni Everett is an 80 yo female who was referred for MBSS by Dr. Leanord Hawking. Pt is currently residing at [facility] post stay at [facility], Geriatric Psych Unit, 05/01/2014 through 07/08/2014. She was seen in the ER at [facility] on 04/26/2014, at which time she had agitation and confusion. The patient was noted on admission to [facility] to have a severe level of disorientation, confusion, and memory loss. The problem was difficulty controlling her aggressive behavior. She had multiple trials of medications in an attempt to reduce anger outbursts and oppositional mood swings. Eventually, they stabilized her. She also had intercurrent illnesses during the hospitalization, including pneumonia and a diarrheal illness. Current diet is unknown.     Recommendation/Prognosis  Clinical Impression:   Dysphagia Diagnosis: Moderate oral phase dysphagia;Moderate pharyngeal phase dysphagia Clinical impression: Pt essentially with severe cogntive based dysphagia which negatively impacts all phases of swallow. Pt only mildly cooperative and willing to take bolus, frequently stating "help me". She needed max encouragement to participate. She held puree bolus in oral cavity before spilling over base of tongue (without significant lingual propulsion). Swallow trigger was prompt and pharyngeal clearance  adequate. Thin liquids spilled over base of tongue to the valleculae and pyiforms (variable), pt with reduced hyolaryngeal excursion and decreased airway protection. Gross, silent aspiration occurred when pt took sequential straw sips of thin. Vocal quality was extremely wet and barium noted in laryngeal vestibule and down anterior tracheal wall, but pt did not attempt to cough or clear. When pt was cued to cough, she occasionally responded but was unable to clear. Pt did best with teaspoon presentation NTL and cup sips HTL and mech soft/puree. Recommend D3/mech soft with honey-thick liquids by cup. Pt is at risk for aspiration given significant cognitive impairment, decreased protective response, and decreased sensation.   Swallow Evaluation Recommendations:  Diet Recommendations: Dysphagia 3 (Mechanical Soft);Honey-thick liquid Liquid Administration via: Cup;Spoon Medication Administration: Crushed with puree Supervision: Staff to assist with self feeding;Full supervision/cueing for compensatory strategies Compensations: Check for pocketing;Slow rate Postural Changes and/or Swallow Maneuvers: Seated upright 90 degrees;Upright 30-60 min after meal Oral Care Recommendations: Staff/trained caregiver to provide oral care Other Recommendations: Clarify dietary restrictions Follow up Recommendations: Skilled Nursing facility    Prognosis:  Prognosis for Safe Diet Advancement: Guarded Barriers to Reach Goals: Cognitive deficits   Individuals Consulted: Consulted and Agree with Results and Recommendations: Patient unable/family or caregiver not available Report Sent to : Referring physician;Facility (Comment)     General: Date of Onset: 07/20/14 Type of Study: Modified Barium Swallowing Study Reason for Referral: Objectively evaluate swallowing function Previous Swallow Assessment: none on record Diet Prior to this Study: Information not available Temperature Spikes Noted: N/A Respiratory  Status: Room air History of Recent Intubation: No Behavior/Cognition: Confused;Requires cueing;Doesn't follow directions;Decreased sustained attention Oral Motor / Sensory  Function: Within functional limits (unable to formally assess due to decreased cognition) Self-Feeding Abilities: Needs assist Patient Positioning: Upright in chair Baseline Vocal Quality: Clear Volitional Cough: Weak Volitional Swallow: Unable to elicit Anatomy: Within functional limits Pharyngeal Secretions: Not observed secondary MBS   Reason for Referral:   Objectively evaluate swallowing function    Oral Phase: Oral Preparation/Oral Phase Oral Phase: Impaired Oral - Honey Oral - Honey Cup: Weak lingual manipulation;Lingual/palatal residue;Delayed oral transit Oral - Nectar Oral - Nectar Teaspoon: Weak lingual manipulation;Lingual/palatal residue;Delayed oral transit Oral - Nectar Cup: Weak lingual manipulation;Right anterior bolus loss;Reduced posterior propulsion;Holding of bolus;Lingual/palatal residue;Delayed oral transit Oral - Thin Oral - Thin Cup: Weak lingual manipulation;Incomplete tongue to palate contact;Reduced posterior propulsion;Lingual/palatal residue;Delayed oral transit Oral - Thin Straw: Lingual/palatal residue;Delayed oral transit Oral - Solids Oral - Puree: Weak lingual manipulation;Lingual pumping;Reduced posterior propulsion;Holding of bolus;Lingual/palatal residue;Piecemeal swallowing;Delayed oral transit Oral - Mechanical Soft: Weak lingual manipulation;Lingual/palatal residue;Piecemeal swallowing;Delayed oral transit Oral Phase - Comment Oral Phase - Comment: Max encouragement for intake   Pharyngeal Phase:  Pharyngeal Phase Pharyngeal Phase: Impaired Pharyngeal - Honey Pharyngeal - Honey Cup: Delayed swallow initiation;Premature spillage to valleculae;Reduced anterior laryngeal mobility;Reduced laryngeal elevation;Reduced tongue base retraction;Pharyngeal residue -  valleculae;Pharyngeal residue - posterior pharnyx Pharyngeal - Nectar Pharyngeal - Nectar Teaspoon: Delayed swallow initiation;Premature spillage to valleculae;Reduced anterior laryngeal mobility;Reduced laryngeal elevation;Pharyngeal residue - valleculae Pharyngeal - Nectar Cup: Delayed swallow initiation;Premature spillage to valleculae;Premature spillage to pyriform sinuses;Reduced laryngeal elevation;Reduced anterior laryngeal mobility;Reduced airway/laryngeal closure;Reduced tongue base retraction;Penetration/Aspiration during swallow;Trace aspiration;Pharyngeal residue - valleculae;Pharyngeal residue - pyriform sinuses;Lateral channel residue Penetration/Aspiration details (nectar cup): Material enters airway, passes BELOW cords without attempt by patient to eject out (silent aspiration);Material enters airway, remains ABOVE vocal cords then ejected out Pharyngeal - Thin Pharyngeal - Thin Cup: Delayed swallow initiation;Premature spillage to valleculae;Premature spillage to pyriform sinuses;Reduced anterior laryngeal mobility;Reduced laryngeal elevation;Reduced airway/laryngeal closure;Penetration/Aspiration before swallow;Penetration/Aspiration during swallow;Penetration/Aspiration after swallow;Trace aspiration;Pharyngeal residue - valleculae;Pharyngeal residue - pyriform sinuses;Lateral channel residue Penetration/Aspiration details (thin cup): Material enters airway, passes BELOW cords without attempt by patient to eject out (silent aspiration);Material enters airway, CONTACTS cords and not ejected out;Material enters airway, remains ABOVE vocal cords and not ejected out Pharyngeal - Thin Straw: Delayed swallow initiation;Premature spillage to valleculae;Premature spillage to pyriform sinuses;Reduced anterior laryngeal mobility;Reduced laryngeal elevation;Reduced airway/laryngeal closure;Penetration/Aspiration before swallow;Penetration/Aspiration during swallow;Penetration/Aspiration after  swallow;Pharyngeal residue - valleculae;Pharyngeal residue - pyriform sinuses;Lateral channel residue;Significant aspiration (Amount);Reduced pharyngeal peristalsis;Reduced epiglottic inversion Penetration/Aspiration details (thin straw): Material enters airway, passes BELOW cords without attempt by patient to eject out (silent aspiration) Pharyngeal - Solids Pharyngeal - Puree: Pharyngeal residue - valleculae (mild) Pharyngeal - Mechanical Soft: Pharyngeal residue - valleculae Pharyngeal - Pill: Not tested Pharyngeal Phase - Comment Pharyngeal Comment: Cognition and reduced sensation negatively impact swallow function   Cervical Esophageal Phase  Cervical Esophageal Phase [facility]ervical Esophageal [facility]: [facility]   GN  Functional Assessment Tool Used: MBSS Functional Limitations: Swallowing Swallow Current Status ([id number]([id number]): At least 60 percent but less than 80 percent impaired, limited or restricted Swallow Goal Status [phone](G8997): At least 20 percent but less than 40 percent impaired, limited or restricted Swallow Discharge Status [phone]([id number]): At least 60 percent but less than 80 percent impaired, limited or restricted     Thank you,  Havery MorosDabney Ellean Firman, CCC-SLP [phone]   Nyliah Nierenberg 08/02/2014, 2:59 PM

## 2014-08-03 ENCOUNTER — Non-Acute Institutional Stay (SKILLED_NURSING_FACILITY): Payer: Medicare Other | Admitting: Internal Medicine

## 2014-08-03 DIAGNOSIS — F0281 Dementia in other diseases classified elsewhere with behavioral disturbance: Secondary | ICD-10-CM

## 2014-08-03 DIAGNOSIS — G219 Secondary parkinsonism, unspecified: Secondary | ICD-10-CM

## 2014-08-03 DIAGNOSIS — F02818 Dementia in other diseases classified elsewhere, unspecified severity, with other behavioral disturbance: Secondary | ICD-10-CM

## 2014-08-05 ENCOUNTER — Ambulatory Visit (HOSPITAL_COMMUNITY)
Admission: RE | Admit: 2014-08-05 | Discharge: 2014-08-05 | Disposition: A | Discharge status: Home / Self Care | Payer: Medicare Other | Source: Ambulatory Visit | Attending: Internal Medicine | Admitting: Internal Medicine

## 2014-08-05 DIAGNOSIS — R5381 Other malaise: Secondary | ICD-10-CM | POA: Insufficient documentation

## 2014-08-05 DIAGNOSIS — R5383 Other fatigue: Secondary | ICD-10-CM | POA: Diagnosis not present

## 2014-08-05 DIAGNOSIS — IMO0001 Reserved for inherently not codable concepts without codable children: Secondary | ICD-10-CM

## 2014-08-05 DIAGNOSIS — Z9181 History of falling: Secondary | ICD-10-CM | POA: Diagnosis not present

## 2014-08-05 DIAGNOSIS — W19XXXA Unspecified fall, initial encounter: Secondary | ICD-10-CM

## 2014-08-05 NOTE — Progress Notes (Signed)
Patient ID: Toni Everett, female   DOB: 06/02/1928, 80 y.o.   MRN: [id number]               PROGRESS NOTE  DATE:  08/03/2014    [facility]    LEVEL OF CARE:   SNF   Acute Visit   CHIEF COMPLAINT:  Poor oral intake, review of mental status.    HISTORY OF PRESENT ILLNESS:  This is a patient whom I admitted to the facility on 07/13/2014.  She came [facility] from [facility] where she had been admitted due to the consequences of dementia with psychosis.  She underwent multiple different attempts at medications and I think was actually in the facility for over two months.  Eventually, she came out on Aricept, Lexapro, and Navane 10 mg at bedtime.    When I saw her on 07/13/2014, I noted some degree of parkinsonism.  However, given the extent of the difficult admission at [facility], I was reluctant to immediately change her antipsychotic and I am seeing her today in follow-up.    In discussion with the nursing staff, she is not doing particularly well.  I note that she had a modified barium swallow that showed some degree of aspiration.  She is not eating well, 25-50% at best.    PHYSICAL EXAMINATION:   VITAL SIGNS:   O2 SATURATIONS:  94% on room air.   PULSE:  76.   RESPIRATIONS:  18 and unlabored.   GENERAL APPEARANCE:  The patient is seen sitting in her reclining wheelchair.   CHEST/RESPIRATORY:  Shallow air entry.   No crackles or wheezes.   CARDIOVASCULAR:  CARDIAC:   Heart sounds are normal.  There are no murmurs.  I think there is some degree of dehydration.   GASTROINTESTINAL:  LIVER/SPLEEN/KIDNEYS:  No liver, no spleen.  No tenderness.   GENITOURINARY:  BLADDER:   No suprapubic or costovertebral angle tenderness.   NEUROLOGICAL:   The patient appears to be increasingly parkinsonian.  She has increased tone in her arms and legs.  Even some neck rigidity is noted.    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  This is going  to need to be stopped.  This is significantly worse than when I saw her a little over two weeks ago.  Consider adding anticholinergic medications for a short term.    Dementia with behavioral disturbances.  As far as I am able to elicit, this is not an issue currently, although abrupt changes in medications could certainly make this a worsening situation.    ?Dehydration.   I am looking for lab work that has been done recently.    This is a difficult situation.  Hopefully, we can reduce her parkinsonism, improve her oral intake and swallowing without making her behavior uncontrollable.    CPT CODE: [id number]         ADDENDUM:  I note that the patient was treated with doxycycline for 10 days earlier this month for a presumed UTI, although the culture grew three organisms and is likely to be a contaminant.

## 2014-08-11 ENCOUNTER — Encounter: Payer: Self-pay | Admitting: Internal Medicine

## 2014-08-11 ENCOUNTER — Non-Acute Institutional Stay (SKILLED_NURSING_FACILITY): Payer: Medicare Other | Admitting: Internal Medicine

## 2014-08-11 DIAGNOSIS — G219 Secondary parkinsonism, unspecified: Secondary | ICD-10-CM

## 2014-08-11 DIAGNOSIS — F0281 Dementia in other diseases classified elsewhere with behavioral disturbance: Secondary | ICD-10-CM

## 2014-08-11 DIAGNOSIS — F02818 Dementia in other diseases classified elsewhere, unspecified severity, with other behavioral disturbance: Secondary | ICD-10-CM

## 2014-08-11 DIAGNOSIS — E43 Unspecified severe protein-calorie malnutrition: Secondary | ICD-10-CM

## 2014-08-11 NOTE — Progress Notes (Signed)
This encounter was created in error - please disregard.  This encounter was created in error - please disregard.

## 2014-08-16 ENCOUNTER — Other Ambulatory Visit: Payer: Self-pay | Admitting: *Deleted

## 2014-08-16 MED ORDER — LORAZEPAM 0.5 MG PO TABS: ORAL_TABLET | ORAL | Status: DC

## 2014-08-16 NOTE — Telephone Encounter (Signed)
[facility] 

## 2014-08-17 ENCOUNTER — Non-Acute Institutional Stay (SKILLED_NURSING_FACILITY): Payer: Medicare Other | Admitting: Internal Medicine

## 2014-08-17 DIAGNOSIS — G219 Secondary parkinsonism, unspecified: Secondary | ICD-10-CM

## 2014-08-17 DIAGNOSIS — F0281 Dementia in other diseases classified elsewhere with behavioral disturbance: Secondary | ICD-10-CM

## 2014-08-17 DIAGNOSIS — F02818 Dementia in other diseases classified elsewhere, unspecified severity, with other behavioral disturbance: Secondary | ICD-10-CM

## 2014-08-17 NOTE — Progress Notes (Addendum)
Patient ID: Toni Everett, female   DOB: [birth year], 80 y.o.   MRN: [id number]               PROGRESS NOTE  DATE:  08/10/2014    [facility]    LEVEL OF CARE:   SNF   Acute Visit   CHIEF COMPLAINT:  Review of parkinsonism, poor oral intake.    HISTORY OF PRESENT ILLNESS:  This is a patient whom I have been following every week when I am here.  I admitted her to the facility on 07/13/2014, having spent almost two months in [facility] as a consequence of dementia and severe psychosis.  She underwent multiple different trials of medication combinations.  Ultimately, they settled her on Aricept, Lexapro, and Navane 10 mg at bedtime.    When I first saw her, she had some elements of parkinsonism.  However, when I returned from two weeks of vacation and saw her last week, she was very rigid.  She was almost unarousable.  Her whole body could be lifted up just by supporting her at the shoulders.  I discontinued the Navane abruptly, put her on anticholinergics.  It would appear that there has been some improvement.  She is now more awake.  She is eating still very poorly, albeit increased at roughly 25% of her meals.    I have discussed the situation with her son.   Apparently, this woman has had progressive dementia over several years.  Nevertheless, she was living in her own home with her son nearby.  She was able to do her basic ADLs, still was continent as recently as two weeks before her trip to [place].  In any case, she became acutely agitated and psychotic to the point that she could not be controlled.  They had not had prior histories of behavioral issues.  She ultimately ended up in [place] as described.    LABORATORY DATA:   Lab work on 08/05/2014 showed a BUN of 39, a creatinine of 1.34.    REVIEW OF SYSTEMS:    GENERAL:  She has lost from 119 pounds on 07/08/2014 down to 109 pounds.  Her intake, however, albeit improved, is still not adequate.     PHYSICAL EXAMINATION:   GENERAL APPEARANCE:  The patient is more awake and certainly less rigid than a week ago.   CHEST/RESPIRATORY:  Clear air entry bilaterally.   CARDIOVASCULAR:  CARDIAC:   Heart sounds are normal.  She appears to be euvolemic.   GASTROINTESTINAL:  ABDOMEN:   As much as I can tell, there is no tenderness.   LIVER/SPLEEN/KIDNEYS:  No liver, no spleen.   GENITOURINARY:  BLADDER:   No suprapubic or costovertebral angle tenderness.     NEUROLOGICAL:   She is restless, picking at my stethoscope, etc.  She appears able to move all her limbs.  I suspect she may have tardive dyskinesia (toe-tapping tremor).    ASSESSMENT/PLAN:  Dementia with behavioral disturbances.  Before this occurred, from discussion today with her son, it would appear that the patient probably had moderate to severe dementia but was functional in her supervised environment.  She has certainly deteriorated quite a bit in the last two months I do not have a satisfactory explanation for this.  Very significant parkinsonism.  This is improved off the navane.  I am going to continue the trihexyphenidyl for probably the next 1 week.  I will follow up on her her clinical status.    Inadequate  oral intake with weight loss as described.  I discussed feeding tube options with the son.  They have already discussed this among his family and they are not interested in this.  We will do the best we can within the facility.  She is a DNR.  Psychosis; Related to her dementia per [facility]. I have discontinued the Navane due to the severe Parkinsonism described above. As the Geriatric program at [facility] is a solid well respected program, I am expecting return of the psychosis which will need to be followed carefully. I am expecting to have return of the psychosis.   CPT CODE: 16109

## 2014-08-18 DIAGNOSIS — E43 Unspecified severe protein-calorie malnutrition: Secondary | ICD-10-CM | POA: Insufficient documentation

## 2014-08-25 NOTE — Progress Notes (Addendum)
Patient ID: Toni Everett, female   DOB: [birth year], 80 y.o.   MRN: [id number]               PROGRESS NOTE  DATE:  08/17/2014    [facility]    LEVEL OF CARE:   SNF   Acute Visit   CHIEF COMPLAINT:  Review of mental status.    HISTORY OF PRESENT ILLNESS:  This is a patient whom I admitted at the end of July.  She came to [place] from [facility] where she had been admitted as a consequence of dementia with psychosis.  She underwent several different attempts at medication changes and was actually in the facility in the geriatric wing for over two months.    She came to [place] on Navane 10 mg at bedtime.  When I saw her on 07/13/2014, I noted some degree of parkinsonism.  When I saw her in follow-up on 08/03/2014, she was rigid to the point that I could lift her entire body up via her head.  At that point, the Navane was discontinued.  I put her on trihexyphenidyl for two weeks, which is ending today.  She is more mobile, more verbal.  However, she is not really eating that well.    Lab work that I ordered last week is pending.    CURRENT MEDICATIONS:  Medication list is reviewed.     Lexapro 20 q.d.     Ativan 0.5 daily.    Zestoretic 20/12.5 daily.    K-dur 20 mEq daily.    Coreg 3.125 b.i.d.    Aricept 10 q.d.    Remeron 7.5 for depression and appetite.    As mentioned, she has been on trihexyphenidyl 5 mg b.i.d.  I think this can probably stop.    PHYSICAL EXAMINATION:   GENERAL APPEARANCE:  I saw her sitting up in her Geri-chair.   CARDIOVASCULAR:  CARDIAC:   Heart sounds are normal.  She does not appear to be that dehydrated.   CHEST/RESPIRATORY:  Exam is clear.   GASTROINTESTINAL:  LIVER/SPLEEN/KIDNEYS:  No liver, no spleen.  No tenderness.   GENITOURINARY:  BLADDER:   No fullness.  No tenderness.  There is no CVA tenderness.   NEUROLOGICAL:   Her rigidity is a lot better, although she still seems to have some degree of nuchal rigidity.   She has episodic toe-tapping tremors and it is possible that she has tardive dyskinesia.   PSYCHIATRIC:   MENTAL STATUS:   The patient is crying episodically.  I think it is quite likely she is psychotic.  She makes references to things she sees that are scaring her and attempts to point at them..  It is very difficult to be certain about this.  However, it is quite possible that she is having very severe visual hallucinations.     ASSESSMENT/PLAN:  Dementia with depression and psychosis.  I think this lady is really having visual hallucinations which are terrifying to her.  In my mind, this justifies reintroduction of antipsychotic.  I will probably use one of the newer agents at a reasonably small dose.  This will be in combination with her Lexapro.    Drug-induced parkinsonism.   Secondary to Navane.   I think the trihexyphenidyl can stop and I will try to follow this.    Dementia with behavioral disturbances, failure to thrive.  She is apparently eating poorly.

## 2014-08-27 ENCOUNTER — Emergency Department (HOSPITAL_COMMUNITY): Payer: Medicare Other

## 2014-08-27 ENCOUNTER — Encounter (HOSPITAL_COMMUNITY): Payer: Self-pay | Admitting: Emergency Medicine

## 2014-08-27 ENCOUNTER — Emergency Department (HOSPITAL_COMMUNITY)
Admission: EM | Admit: 2014-08-27 | Discharge: 2014-08-28 | Disposition: A | Discharge status: Home / Self Care | Payer: Medicare Other | Attending: Emergency Medicine | Admitting: Emergency Medicine

## 2014-08-27 DIAGNOSIS — Y939 Activity, unspecified: Secondary | ICD-10-CM | POA: Insufficient documentation

## 2014-08-27 DIAGNOSIS — Z860100 Personal history of colon polyps, unspecified: Secondary | ICD-10-CM | POA: Insufficient documentation

## 2014-08-27 DIAGNOSIS — Z87891 Personal history of nicotine dependence: Secondary | ICD-10-CM | POA: Diagnosis not present

## 2014-08-27 DIAGNOSIS — G309 Alzheimer's disease, unspecified: Secondary | ICD-10-CM | POA: Insufficient documentation

## 2014-08-27 DIAGNOSIS — Y929 Unspecified place or not applicable: Secondary | ICD-10-CM | POA: Diagnosis not present

## 2014-08-27 DIAGNOSIS — I10 Essential (primary) hypertension: Secondary | ICD-10-CM | POA: Insufficient documentation

## 2014-08-27 DIAGNOSIS — Z8739 Personal history of other diseases of the musculoskeletal system and connective tissue: Secondary | ICD-10-CM | POA: Insufficient documentation

## 2014-08-27 DIAGNOSIS — Z79899 Other long term (current) drug therapy: Secondary | ICD-10-CM | POA: Insufficient documentation

## 2014-08-27 DIAGNOSIS — Z8639 Personal history of other endocrine, nutritional and metabolic disease: Secondary | ICD-10-CM | POA: Diagnosis not present

## 2014-08-27 DIAGNOSIS — W19XXXA Unspecified fall, initial encounter: Secondary | ICD-10-CM | POA: Diagnosis not present

## 2014-08-27 DIAGNOSIS — IMO0002 Reserved for concepts with insufficient information to code with codable children: Secondary | ICD-10-CM | POA: Diagnosis present

## 2014-08-27 DIAGNOSIS — Z8719 Personal history of other diseases of the digestive system: Secondary | ICD-10-CM | POA: Insufficient documentation

## 2014-08-27 DIAGNOSIS — Z862 Personal history of diseases of the blood and blood-forming organs and certain disorders involving the immune mechanism: Secondary | ICD-10-CM | POA: Insufficient documentation

## 2014-08-27 DIAGNOSIS — F0280 Dementia in other diseases classified elsewhere without behavioral disturbance: Secondary | ICD-10-CM | POA: Insufficient documentation

## 2014-08-27 MED ORDER — LORAZEPAM 1 MG PO TABS
0.5000 mg | ORAL_TABLET | Freq: Once | ORAL | Status: AC
  Administered 2014-08-27: 0.5 mg via ORAL
  Filled 2014-08-27: qty 1

## 2014-08-27 NOTE — ED Notes (Signed)
Per EMS, patient is a resident from [facility]; patient had a fall.  Patient cries out when you touch her.

## 2014-08-27 NOTE — ED Notes (Signed)
Called c-com at this time about transport back to [facility] c-com stated that there will be wait . Toni Everett

## 2014-08-27 NOTE — ED Provider Notes (Signed)
CSN: [id number]     Arrival date & time 08/27/14  2139 History   First MD Initiated Contact with Patient 08/27/14 2145    This chart was scribed for Benny Lennert, MD by Marica Otter, ED Scribe. This patient was seen in room APA14/APA14 and the patient's care was started at 9:52 PM. LEVEL 5 CAVEAT: DEMENTIA  Chief Complaint  Patient presents with  . Fall   Patient is a 80 y.o. female presenting with fall. The history is provided by the EMS personnel. No language interpreter was used.  Fall   PCP: Rudi Heap, MD HPI Comments: Toni Everett is a 80 y.o. female, whose medical Hx is noted below and is a resident of [place], brought in by ambulance, who presents to the Emergency Department complaining of an unwitnessed fall sustained earlier this evening.   Past Medical History  Diagnosis Date  . IBS (irritable bowel syndrome)   . Nicotine addiction   . Diverticulosis   . Osteopenia   . Elevated blood pressure   . Colon polyps   . Inflammatory polyps of colon with rectal bleeding   . Gastritis   . Hypertension   . Hyperlipidemia   . Alzheimer's dementia    History reviewed. No pertinent past surgical history. No family history on file. History  Substance Use Topics  . Smoking status: Former Smoker -- 1.00 packs/day    Quit date: 12/16/1998  . Smokeless tobacco: Not on file  . Alcohol Use: No   OB History   Grav Para Term Preterm Abortions TAB SAB Ect Mult Living                 Review of Systems  Unable to perform ROS: Dementia   Allergies  Alcohol-sulfur; Astelin; Baycol; Cholinesterase inhibitors; Clolar; Codeine; Conj estrog-medroxyprogest ace; Diovan; Fexofenadine hcl; Lipitor; Lovaza; Niaspan; Norvasc; and Uniretic  Home Medications   Prior to Admission medications   Medication Sig Start Date End Date Taking? Authorizing Provider  diphenoxylate-atropine (LOMOTIL) 2.5-0.025 MG per tablet Take 1 tablet by mouth daily. 12/01/13   Ernestina Penna, MD   lisinopril-hydrochlorothiazide Marcell Anger) 20-12.5 MG per tablet Take 1 tablet by mouth  every day for blood  pressure 04/21/14   Ernestina Penna, MD  LORazepam (ATIVAN) 0.5 MG tablet Take one tablet by mouth once daily for anxiety; Take one tablet by mouth every 4 hours as needed for anxiety 08/16/14   Oneal Grout, MD   Triage Vitals: BP 143/68  Pulse 83  Temp(Src) 99.8 F (37.7 C) (Rectal)  Resp 12  SpO2 100% Physical Exam  Constitutional: She appears well-developed.  Will open eyes to verbal stimuli Cachectic   HENT:  Head: Normocephalic.  Eyes: Conjunctivae and EOM are normal. No scleral icterus.  Neck: Neck supple. No thyromegaly present.  Cardiovascular: Normal rate and regular rhythm.  Exam reveals no gallop and no friction rub.   No murmur heard. Pulmonary/Chest: No stridor. She has no wheezes. She has no rales. She exhibits no tenderness.  Abdominal: She exhibits no distension. There is no tenderness. There is no rebound.  Musculoskeletal: Normal range of motion. She exhibits no edema.  Contractions to all her extremities.   Lymphadenopathy:    She has no cervical adenopathy.  Skin: No rash noted. No erythema.  Abrasion to left elbow  Psychiatric: She has a normal mood and affect. Her behavior is normal.    ED Course  Procedures (including critical care time) Labs Review Labs Reviewed - No  data to display  Imaging Review No results found.   EKG Interpretation None      MDM   Final diagnoses:  None    Fall no injuries  The chart was scribed for me under my direct supervision.  I personally performed the history, physical, and medical decision making and all procedures in the evaluation of this patient.Benny Lennert, MD 08/27/14 [phone]

## 2014-08-27 NOTE — Discharge Instructions (Signed)
Follow up as needed

## 2014-08-28 DIAGNOSIS — IMO0002 Reserved for concepts with insufficient information to code with codable children: Secondary | ICD-10-CM | POA: Diagnosis not present

## 2014-08-28 NOTE — ED Notes (Signed)
EMS here to transport pt to [facility].

## 2014-08-31 ENCOUNTER — Non-Acute Institutional Stay (SKILLED_NURSING_FACILITY): Payer: Medicare Other | Admitting: Internal Medicine

## 2014-08-31 DIAGNOSIS — F02818 Dementia in other diseases classified elsewhere, unspecified severity, with other behavioral disturbance: Secondary | ICD-10-CM

## 2014-08-31 DIAGNOSIS — F0281 Dementia in other diseases classified elsewhere with behavioral disturbance: Secondary | ICD-10-CM

## 2014-08-31 DIAGNOSIS — A0472 Enterocolitis due to Clostridium difficile, not specified as recurrent: Secondary | ICD-10-CM

## 2014-09-06 NOTE — Progress Notes (Addendum)
Patient ID: Toni Everett, female   DOB: 07/25/1928, 80 y.o.   MRN: [id number]               PROGRESS NOTE  DATE:  08/31/2014    FACILITY: [facility]    LEVEL OF CARE:   SNF   Acute Visit   CHIEF COMPLAINT:  Diarrhea and other issues.     HISTORY OF PRESENT ILLNESS:  This is a very frail woman whom I admitted to the facility at the end of July.  She came to [place] from [facility] where she had been admitted as a consequence of dementia with severe psychosis.  After extensive attempts at adjustments of psychiatric medications, she came to [place] on Navane which I had to take her off of due to severe parkinsonism.    Two weeks ago when I saw her last, she was clearly hallucinating and really scared of what she was seeing.  I put her on a small dose of Abilify.    In the meantime, she has developed diarrhea.  Stool for C.diff has come back positive.    The other major issue with her is that she continues to eat 25-50% only of her meals.    REVIEW OF SYSTEMS:  Otherwise not possible.    PHYSICAL EXAMINATION:   GENERAL APPEARANCE:  The patient is much more awake.   CHEST/RESPIRATORY:  Clear air entry bilaterally.    CARDIOVASCULAR:  CARDIAC:   Heart sounds are normal.  There is still some evidence of dehydration.   GASTROINTESTINAL:  ABDOMEN:   Somewhat quiet.   LIVER/SPLEEN/KIDNEYS:  No liver, no spleen.  No tenderness.   GENITOURINARY:  BLADDER:   No suprapubic or costovertebral angle tenderness.   NEUROLOGICAL:    SENSATION/STRENGTH:  She is able to move all her limbs.  I am worried about flexion contractures at the hips and knees.  Her tone is increased, but I think this is considerably better.    ASSESSMENT/PLAN:  Pseudomembranous colitis.  I do not see any reason not to use Flagyl first-line here.  I have written these orders for two weeks.    Dementia with behavioral disturbances.  This woman's psychosis has been severe and clearly tormenting the patient.   Even today when I leaned over to listen to her heart, she looked absolutely terrified that I was going to harm her.  Of course, it is not really possible to convince her of your proper intent.  Nevertheless, I am going to try to increase the Abilify to 4 mg.

## 2014-09-07 ENCOUNTER — Non-Acute Institutional Stay (SKILLED_NURSING_FACILITY): Payer: Medicare Other | Admitting: Internal Medicine

## 2014-09-07 DIAGNOSIS — F0281 Dementia in other diseases classified elsewhere with behavioral disturbance: Secondary | ICD-10-CM

## 2014-09-07 DIAGNOSIS — F02818 Dementia in other diseases classified elsewhere, unspecified severity, with other behavioral disturbance: Secondary | ICD-10-CM

## 2014-09-12 NOTE — Progress Notes (Addendum)
Patient ID: Toni Everett, female   DOB: [birth year], 80 y.o.   MRN: [id number]               PROGRESS NOTE  DATE:  09/07/2014    FACILITY: [facility]     LEVEL OF CARE:   SNF   Acute Visit   CHIEF COMPLAINT:  Review of mental status.    HISTORY OF PRESENT ILLNESS:  This is a patient whom I admitted at the end of July, coming from [facility] where she was admitted as the consequence of severe dementia with psychosis.  She ultimately came to [place] on Navane and developed rigidity to the point that I could literally lift her entire body off the bed just using my hand behind her neck.  I discontinued this and have reintroduced Abilify 4 mg q.d.    She was treated with Flagyl for C.diff, although that seems better.    Lab work including a basic metabolic panel and CBC was ordered, although I do not see these results at the moment.      She was seen by Dr. Park Breed of Pain Management.   She was put on Fentanyl patch at 12 mcg/hr.  I am seeing her today because of this.    PHYSICAL EXAMINATION:   VITAL SIGNS:   RESPIRATIONS:  10.   GENERAL APPEARANCE:  The patient is asleep.  Pupils are pinpoint.  I think this is a narcotic effect, although she does appear to be more comfortable.    ASSESSMENT/PLAN:  Pre-terminal dementia.  She appears more comfortable on the Fentanyl.  Therefore, I have left this.  I discussed this with the daughter at the bedside.  It is unlikely she will eat and drink.  She is already probably dehydrated.     Comfort measures are in order.  She is No Code and No Hospitalizations.  I have left no further orders.

## 2014-09-14 ENCOUNTER — Non-Acute Institutional Stay (SKILLED_NURSING_FACILITY): Payer: Medicare Other | Admitting: Internal Medicine

## 2014-09-14 DIAGNOSIS — R404 Transient alteration of awareness: Secondary | ICD-10-CM

## 2014-09-14 DIAGNOSIS — R410 Disorientation, unspecified: Secondary | ICD-10-CM

## 2014-09-18 NOTE — Progress Notes (Addendum)
Patient ID: Toni Everett, female   DOB: 06/06/1928, 80 y.o.   MRN: [id number]               PROGRESS NOTE  DATE:  09/14/2014    [facility]    LEVEL OF CARE:   SNF   Acute Visit   CHIEF COMPLAINT:  Review of mental status.    HISTORY OF PRESENT ILLNESS:  This continues to be a difficult situation for the facility and this patient's family.  She is a lady who came to us at the end of July after spending a protracted stay at [facility].  She had severe dementia with psychosis.  She had a multitude of different medication trials and came to us on Navane 10 mg.    Two weeks after her arrival here, you could lift her body off the bed through her head.  We discontinued the Navane and treated her with anticholinergics with some improvement.  She then became psychotic and I reintroduced Abilify.   She was treated for C.diff with Flagyl.  There was some question in the facility as to whether this represented pain and she was started on Fentanyl by Dr. Park BreedKahn.     Apparently, although she was literally comatose last week when I saw her, a day or two later she started screaming incessantly and apparently this has been going on day and night.  She has also had some falls out of the bed.     In the meantime, according to the staff, she eats and drinks virtually nothing.  They report only one wet diaper per day.    PHYSICAL EXAMINATION:   GENERAL APPEARANCE:  The patient is lying in bed, yelling through a hoarse voice.   CHEST/RESPIRATORY:  Shallow, but otherwise clear air entry.   CARDIOVASCULAR:  CARDIAC:  She is grossly dehydrated.   GASTROINTESTINAL:  ABDOMEN:   No focal tenderness.   GENITOURINARY:  BLADDER:   No suprapubic or costovertebral angle tenderness    ASSESSMENT/PLAN:    Pre-terminal delirium.   This is complicated by the use of Fentanyl last week.   This could be narcotic delirium or delirium related to hypernatremia and/or  prerenal azotemia.  I started her on Haldol 1 mg po  t.i.d. scheduled.  This has not had the desired effect.    I think this patient is entering a terminal state.  I am going to discontinue all of her medications.  Use Roxanol scheduled.  I am going to add parenteral  Haldol.  This is indeed an unfortunate situation which I have discussed with her son, his desire is a peaceful end and I will try to provide this.   CPT CODE: [id number]

## 2014-10-16 DEATH — deceased

## 2014-10-22 IMAGING — CR DG CHEST 1V
1 series · 1 of 1 positions shown · non-contrast
Comparison: Chest radiograph performed 04/30/2014

CLINICAL DATA: Status post fall. Concern for chest injury.
Congestion.

EXAM:
CHEST - 1 VIEW

[view not recorded]
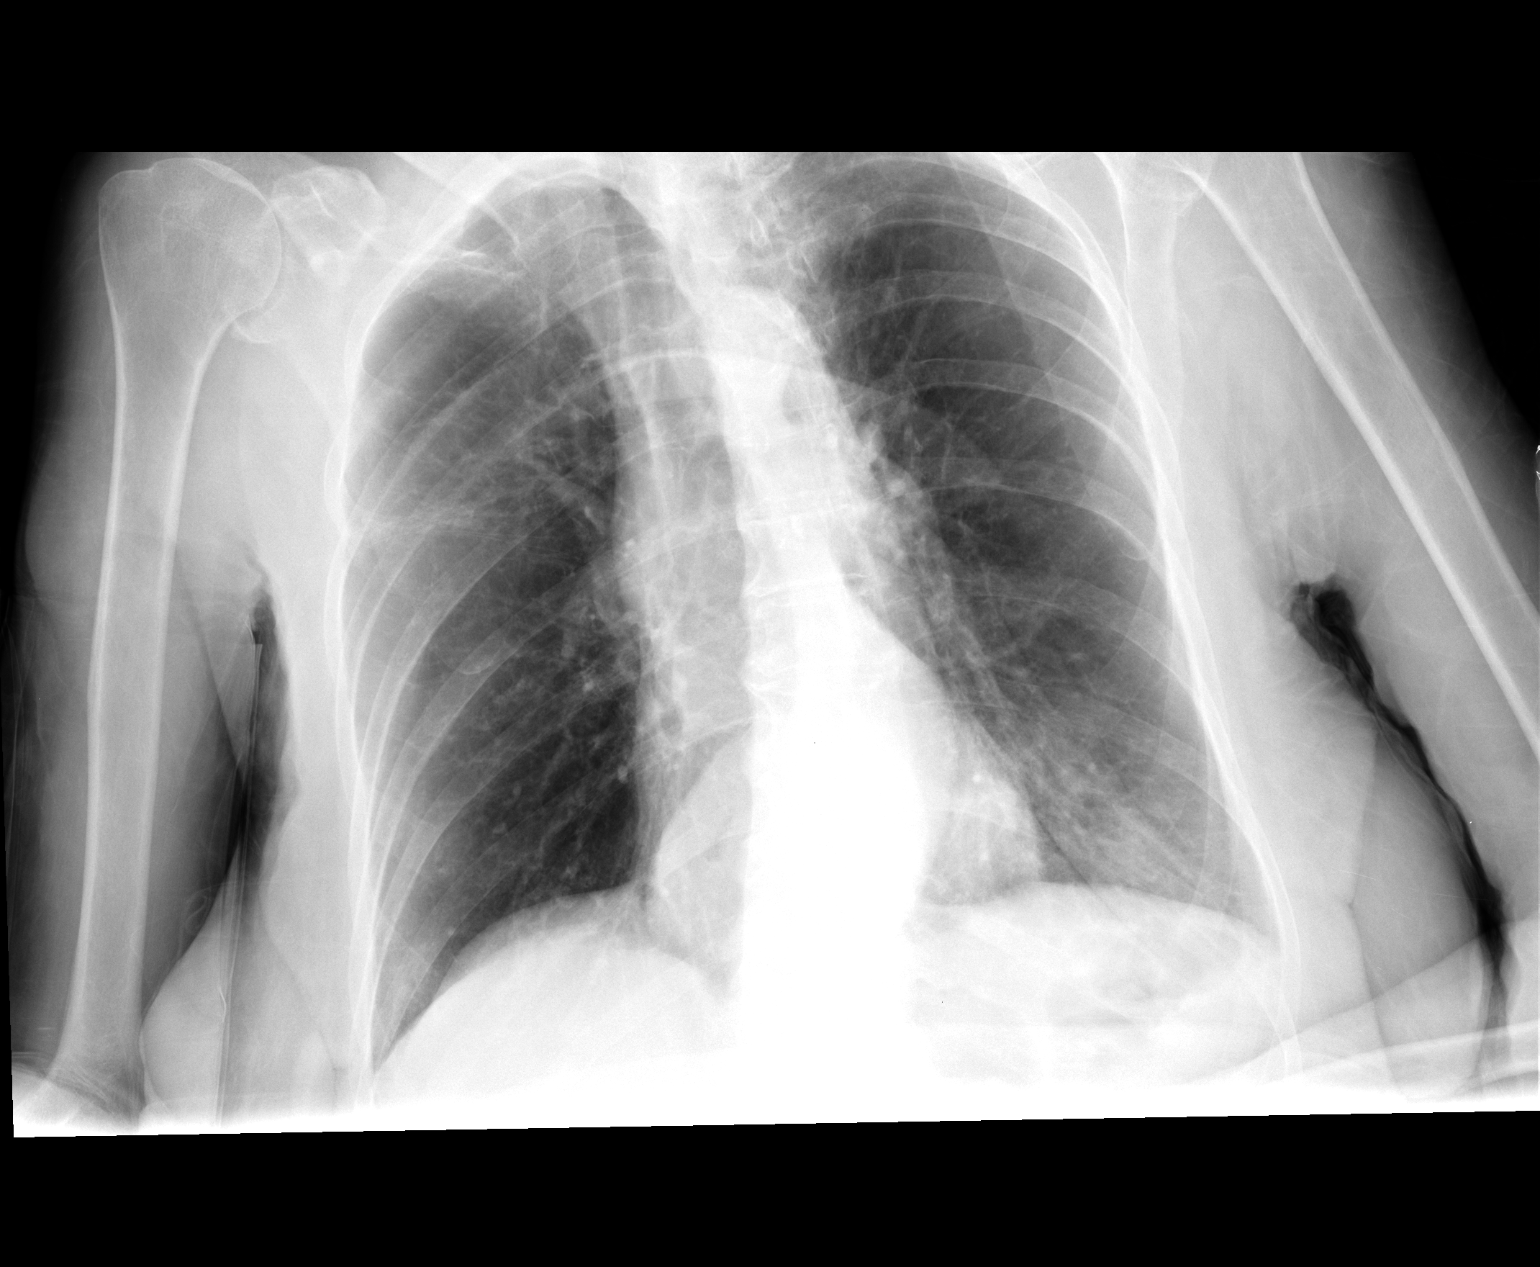

[1 of 1 positions shown; findings below may reference images not displayed]

FINDINGS: The lungs are well-aerated. Minimal left basilar opacity likely
reflects atelectasis. Mild right mid lung atelectasis or scarring is
seen. There is no evidence of pleural effusion or pneumothorax.

The cardiomediastinal silhouette is within normal limits. No acute
osseous abnormalities are seen. A moderate hiatal hernia is
suspected.
IMPRESSION: 1. Mild bilateral atelectasis or scarring noted; lungs otherwise
grossly clear.
2. No displaced rib fractures identified.
3. Suspect moderate hiatal hernia.
